# Patient Record
Sex: Female | Born: 1990 | Race: White | Hispanic: No | Marital: Married | Smoking: Never smoker
Health system: Southern US, Community
[De-identification: ages and names within clinical notes are randomized; demographics above are authoritative.]

## PROBLEM LIST (undated history)

## (undated) DIAGNOSIS — Z789 Other specified health status: Secondary | ICD-10-CM

## (undated) HISTORY — PX: HEMICOLECTOMY: SHX854

## (undated) HISTORY — DX: Other specified health status: Z78.9

## (undated) HISTORY — PX: WISDOM TOOTH EXTRACTION: SHX21

---

## 2014-12-11 LAB — HM PAP SMEAR

## 2017-05-01 DIAGNOSIS — M9904 Segmental and somatic dysfunction of sacral region: Secondary | ICD-10-CM | POA: Diagnosis not present

## 2017-05-01 DIAGNOSIS — M4608 Spinal enthesopathy, sacral and sacrococcygeal region: Secondary | ICD-10-CM | POA: Diagnosis not present

## 2017-05-01 DIAGNOSIS — M9903 Segmental and somatic dysfunction of lumbar region: Secondary | ICD-10-CM | POA: Diagnosis not present

## 2017-05-03 DIAGNOSIS — M4608 Spinal enthesopathy, sacral and sacrococcygeal region: Secondary | ICD-10-CM | POA: Diagnosis not present

## 2017-05-03 DIAGNOSIS — M9904 Segmental and somatic dysfunction of sacral region: Secondary | ICD-10-CM | POA: Diagnosis not present

## 2017-05-03 DIAGNOSIS — M9903 Segmental and somatic dysfunction of lumbar region: Secondary | ICD-10-CM | POA: Diagnosis not present

## 2017-05-14 DIAGNOSIS — M9904 Segmental and somatic dysfunction of sacral region: Secondary | ICD-10-CM | POA: Diagnosis not present

## 2017-05-14 DIAGNOSIS — M9903 Segmental and somatic dysfunction of lumbar region: Secondary | ICD-10-CM | POA: Diagnosis not present

## 2017-05-14 DIAGNOSIS — M4608 Spinal enthesopathy, sacral and sacrococcygeal region: Secondary | ICD-10-CM | POA: Diagnosis not present

## 2017-05-15 DIAGNOSIS — M4608 Spinal enthesopathy, sacral and sacrococcygeal region: Secondary | ICD-10-CM | POA: Diagnosis not present

## 2017-05-15 DIAGNOSIS — M9903 Segmental and somatic dysfunction of lumbar region: Secondary | ICD-10-CM | POA: Diagnosis not present

## 2017-05-15 DIAGNOSIS — M9904 Segmental and somatic dysfunction of sacral region: Secondary | ICD-10-CM | POA: Diagnosis not present

## 2017-05-17 DIAGNOSIS — M9904 Segmental and somatic dysfunction of sacral region: Secondary | ICD-10-CM | POA: Diagnosis not present

## 2017-05-17 DIAGNOSIS — M9903 Segmental and somatic dysfunction of lumbar region: Secondary | ICD-10-CM | POA: Diagnosis not present

## 2017-05-17 DIAGNOSIS — M4608 Spinal enthesopathy, sacral and sacrococcygeal region: Secondary | ICD-10-CM | POA: Diagnosis not present

## 2017-05-21 DIAGNOSIS — M9903 Segmental and somatic dysfunction of lumbar region: Secondary | ICD-10-CM | POA: Diagnosis not present

## 2017-05-21 DIAGNOSIS — M9904 Segmental and somatic dysfunction of sacral region: Secondary | ICD-10-CM | POA: Diagnosis not present

## 2017-05-21 DIAGNOSIS — M4608 Spinal enthesopathy, sacral and sacrococcygeal region: Secondary | ICD-10-CM | POA: Diagnosis not present

## 2017-05-22 DIAGNOSIS — M9903 Segmental and somatic dysfunction of lumbar region: Secondary | ICD-10-CM | POA: Diagnosis not present

## 2017-05-22 DIAGNOSIS — M4608 Spinal enthesopathy, sacral and sacrococcygeal region: Secondary | ICD-10-CM | POA: Diagnosis not present

## 2017-05-22 DIAGNOSIS — M9904 Segmental and somatic dysfunction of sacral region: Secondary | ICD-10-CM | POA: Diagnosis not present

## 2017-05-25 DIAGNOSIS — M9903 Segmental and somatic dysfunction of lumbar region: Secondary | ICD-10-CM | POA: Diagnosis not present

## 2017-05-25 DIAGNOSIS — M4608 Spinal enthesopathy, sacral and sacrococcygeal region: Secondary | ICD-10-CM | POA: Diagnosis not present

## 2017-05-25 DIAGNOSIS — M9904 Segmental and somatic dysfunction of sacral region: Secondary | ICD-10-CM | POA: Diagnosis not present

## 2017-05-29 DIAGNOSIS — M4608 Spinal enthesopathy, sacral and sacrococcygeal region: Secondary | ICD-10-CM | POA: Diagnosis not present

## 2017-05-29 DIAGNOSIS — M9903 Segmental and somatic dysfunction of lumbar region: Secondary | ICD-10-CM | POA: Diagnosis not present

## 2017-05-29 DIAGNOSIS — M9904 Segmental and somatic dysfunction of sacral region: Secondary | ICD-10-CM | POA: Diagnosis not present

## 2017-06-04 DIAGNOSIS — M9904 Segmental and somatic dysfunction of sacral region: Secondary | ICD-10-CM | POA: Diagnosis not present

## 2017-06-04 DIAGNOSIS — M9903 Segmental and somatic dysfunction of lumbar region: Secondary | ICD-10-CM | POA: Diagnosis not present

## 2017-06-04 DIAGNOSIS — M4608 Spinal enthesopathy, sacral and sacrococcygeal region: Secondary | ICD-10-CM | POA: Diagnosis not present

## 2017-06-12 ENCOUNTER — Encounter: Payer: Self-pay | Admitting: Family Medicine

## 2017-06-12 ENCOUNTER — Ambulatory Visit (INDEPENDENT_AMBULATORY_CARE_PROVIDER_SITE_OTHER): Payer: 59 | Admitting: Family Medicine

## 2017-06-12 VITALS — BP 110/80 | HR 75 | Temp 97.9°F | Resp 16 | Ht 63.0 in | Wt 148.0 lb

## 2017-06-12 DIAGNOSIS — M9904 Segmental and somatic dysfunction of sacral region: Secondary | ICD-10-CM | POA: Diagnosis not present

## 2017-06-12 DIAGNOSIS — M4608 Spinal enthesopathy, sacral and sacrococcygeal region: Secondary | ICD-10-CM | POA: Diagnosis not present

## 2017-06-12 DIAGNOSIS — Z0000 Encounter for general adult medical examination without abnormal findings: Secondary | ICD-10-CM

## 2017-06-12 DIAGNOSIS — M9903 Segmental and somatic dysfunction of lumbar region: Secondary | ICD-10-CM | POA: Diagnosis not present

## 2017-06-12 DIAGNOSIS — Z3041 Encounter for surveillance of contraceptive pills: Secondary | ICD-10-CM | POA: Diagnosis not present

## 2017-06-12 MED ORDER — JUNEL 1.5/30 1.5-30 MG-MCG PO TABS: ORAL_TABLET | ORAL | 3 refills | Status: DC

## 2017-06-12 NOTE — Patient Instructions (Signed)
Preventive Care 18-39 Years, Female Preventive care refers to lifestyle choices and visits with your health care provider that can promote health and wellness. What does preventive care include?  A yearly physical exam. This is also called an annual well check.  Dental exams once or twice a year.  Routine eye exams. Ask your health care provider how often you should have your eyes checked.  Personal lifestyle choices, including: ? Daily care of your teeth and gums. ? Regular physical activity. ? Eating a healthy diet. ? Avoiding tobacco and drug use. ? Limiting alcohol use. ? Practicing safe sex. ? Taking vitamin and mineral supplements as recommended by your health care provider. What happens during an annual well check? The services and screenings done by your health care provider during your annual well check will depend on your age, overall health, lifestyle risk factors, and family history of disease. Counseling Your health care provider may ask you questions about your:  Alcohol use.  Tobacco use.  Drug use.  Emotional well-being.  Home and relationship well-being.  Sexual activity.  Eating habits.  Work and work Statistician.  Method of birth control.  Menstrual cycle.  Pregnancy history.  Screening You may have the following tests or measurements:  Height, weight, and BMI.  Diabetes screening. This is done by checking your blood sugar (glucose) after you have not eaten for a while (fasting).  Blood pressure.  Lipid and cholesterol levels. These may be checked every 5 years starting at age 66.  Skin check.  Hepatitis C blood test.  Hepatitis B blood test.  Sexually transmitted disease (STD) testing.  BRCA-related cancer screening. This may be done if you have a family history of breast, ovarian, tubal, or peritoneal cancers.  Pelvic exam and Pap test. This may be done every 3 years starting at age 40. Starting at age 59, this may be done every 5  years if you have a Pap test in combination with an HPV test.  Discuss your test results, treatment options, and if necessary, the need for more tests with your health care provider. Vaccines Your health care provider may recommend certain vaccines, such as:  Influenza vaccine. This is recommended every year.  Tetanus, diphtheria, and acellular pertussis (Tdap, Td) vaccine. You may need a Td booster every 10 years.  Varicella vaccine. You may need this if you have not been vaccinated.  HPV vaccine. If you are 69 or younger, you may need three doses over 6 months.  Measles, mumps, and rubella (MMR) vaccine. You may need at least one dose of MMR. You may also need a second dose.  Pneumococcal 13-valent conjugate (PCV13) vaccine. You may need this if you have certain conditions and were not previously vaccinated.  Pneumococcal polysaccharide (PPSV23) vaccine. You may need one or two doses if you smoke cigarettes or if you have certain conditions.  Meningococcal vaccine. One dose is recommended if you are age 27-21 years and a first-year college student living in a residence hall, or if you have one of several medical conditions. You may also need additional booster doses.  Hepatitis A vaccine. You may need this if you have certain conditions or if you travel or work in places where you may be exposed to hepatitis A.  Hepatitis B vaccine. You may need this if you have certain conditions or if you travel or work in places where you may be exposed to hepatitis B.  Haemophilus influenzae type b (Hib) vaccine. You may need this if  you have certain risk factors.  Talk to your health care provider about which screenings and vaccines you need and how often you need them. This information is not intended to replace advice given to you by your health care provider. Make sure you discuss any questions you have with your health care provider. Document Released: 03/14/2001 Document Revised: 10/06/2015  Document Reviewed: 11/17/2014 Elsevier Interactive Patient Education  [employer].

## 2017-06-12 NOTE — Progress Notes (Signed)
Patient: Melinda Mcdowell, Female    DOB: July 06, 1990, 27 y.o.   MRN: [id number] Visit Date: 06/12/2017  Today's Provider: Lavon Paganini, MD   I, Martha Clan, CMA, am acting as scribe for Lavon Paganini, MD.  Chief Complaint  Patient presents with  . Establish Care   Subjective:    Melinda Mcdowell is a 27 y.o. female who presents today to establish care. She feels well. She reports exercising 6 days per week. She reports she is sleeping well.  Recently moved from [place] for husband's job.  Now working from home.  Thinks her immunization record would be at [place] - she will try to track this down Had pap smear <1 yr ago - Dr. Sydnee Levans in [place]  Previously having menorrhagia and dysmenorrhea. Now on continuous OCPs.   -----------------------------------------------------------------   Review of Systems  Constitutional: Negative.   HENT: Negative.   Eyes: Negative.   Respiratory: Negative.   Cardiovascular: Negative.   Gastrointestinal: Negative.   Endocrine: Negative.   Genitourinary: Negative.   Musculoskeletal: Negative.   Skin: Negative.   Allergic/Immunologic: Negative.   Neurological: Negative.   Hematological: Negative.   Psychiatric/Behavioral: Negative.     Social History      She  reports that she has never smoked. She has never used smokeless tobacco. She reports that she drinks about 1.8 oz of alcohol per week. She reports that she has current or past drug history.       Social History   Socioeconomic History  . Marital status: Married    Spouse name: Tomasita Crumble  . Number of children: 0  . Years of education: 16  . Highest education level: Bachelor's degree (e.g., BA, AB, BS)  Occupational History  . Occupation: Press photographer  Social Needs  . Financial resource strain: Not on file  . Food insecurity:    Worry: Not on file    Inability: Not on file  . Transportation needs:    Medical: Not on file   Non-medical: Not on file  Tobacco Use  . Smoking status: Never Smoker  . Smokeless tobacco: Never Used  Substance and Sexual Activity  . Alcohol use: Yes    Alcohol/week: 1.8 oz    Types: 3 Glasses of wine per week  . Drug use: Not Currently  . Sexual activity: Yes    Partners: Male    Birth control/protection: OCP  Lifestyle  . Physical activity:    Days per week: Not on file    Minutes per session: Not on file  . Stress: Not on file  Relationships  . Social connections:    Talks on phone: Not on file    Gets together: Not on file    Attends religious service: Not on file    Active member of club or organization: Not on file    Attends meetings of clubs or organizations: Not on file    Relationship status: Not on file  Other Topics Concern  . Not on file  Social History Narrative  . Not on file    History reviewed. No pertinent past medical history.   There are no active problems to display for this patient.   Past Surgical History:  Procedure Laterality Date  . WISDOM TOOTH EXTRACTION      Family History        Family Status  Relation Name Status  . Mother  Alive  . Father  Alive  . Brother  Alive  .  MGM  Alive  . Neg Hx  (Not Specified)        Her family history includes Asthma in her brother; Clotting disorder in her maternal grandmother; Diabetes in her maternal grandmother; Healthy in her mother; Hypertension in her father; Ovarian cysts in her mother; Stroke in her maternal grandmother. There is no history of Breast cancer, Colon cancer, Ovarian cancer, or Cervical cancer.      No Known Allergies   Current Outpatient Medications:  .  JUNEL 1.5/30 1.5-30 MG-MCG tablet, TAKE 1 TABLET BY MOUTH CONTINOUSLY. ACTIVE PILL USE, DO NOT TAKE PLACEBO PILLS., Disp: 4 Package, Rfl: 3   Patient Care Team: [name], MD as PCP - General (Family Medicine)      Objective:   Vitals: BP 110/80 (BP Location: Left Arm, Patient Position: Sitting, Cuff  Size: Normal)   Pulse 75   Temp 97.9 F (36.6 C) (Oral)   Resp 16   Ht 5\' 3"  (1.6 m)   Wt 148 lb (67.1 kg)   SpO2 99%   BMI 26.22 kg/m    Vitals:   06/12/17 0919  BP: 110/80  Pulse: 75  Resp: 16  Temp: 97.9 F (36.6 C)  TempSrc: Oral  SpO2: 99%  Weight: 148 lb (67.1 kg)  Height: 5\' 3"  (1.6 m)     Physical Exam  Constitutional: She is oriented to person, place, and time. She appears well-developed and well-nourished. No distress.  HENT:  Head: Normocephalic and atraumatic.  Right Ear: External ear normal.  Left Ear: External ear normal.  Nose: Nose normal.  Mouth/Throat: Oropharynx is clear and moist.  Eyes: Pupils are equal, round, and reactive to light. Conjunctivae and EOM are normal. No scleral icterus.  Neck: Neck supple. No thyromegaly present.  Cardiovascular: Normal rate, regular rhythm, normal heart sounds and intact distal pulses.  No murmur heard. Pulmonary/Chest: Effort normal and breath sounds normal. No respiratory distress. She has no wheezes. She has no rales.  Abdominal: Soft. Bowel sounds are normal. She exhibits no distension. There is no tenderness. There is no rebound and no guarding.  Musculoskeletal: She exhibits no edema or deformity.  Lymphadenopathy:    She has no cervical adenopathy.  Neurological: She is alert and oriented to person, place, and time.  Skin: Skin is warm and dry. Capillary refill takes less than 2 seconds. No rash noted.  Psychiatric: She has a normal mood and affect. Her behavior is normal.  Vitals reviewed.    Depression Screen PHQ 2/9 Scores 06/12/2017  PHQ - 2 Score 0      Assessment & Plan:     Routine Health Maintenance and Physical Exam  Exercise Activities and Dietary recommendations Goals    None       There is no immunization history on file for this patient.  Health Maintenance  Topic Date Due  . HIV Screening  01/20/2007  . TETANUS/TDAP  01/20/2011  . PAP SMEAR  01/19/2013  . INFLUENZA  VACCINE  08/30/2017     Discussed health benefits of physical activity, and encouraged her to engage in regular exercise appropriate for her age and condition.    --------------------------------------------------------------------  The entirety of the information documented in the History of Present Illness, Review of Systems and Physical Exam were personally obtained by me. Portions of this information were initially documented by Raquel Sarna Ratchford, CMA and reviewed by me for thoroughness and accuracy.   [name], MD, MPH [facility] 06/12/2017 9:49 AM

## 2017-06-19 DIAGNOSIS — M4608 Spinal enthesopathy, sacral and sacrococcygeal region: Secondary | ICD-10-CM | POA: Diagnosis not present

## 2017-06-19 DIAGNOSIS — M9903 Segmental and somatic dysfunction of lumbar region: Secondary | ICD-10-CM | POA: Diagnosis not present

## 2017-06-19 DIAGNOSIS — M9904 Segmental and somatic dysfunction of sacral region: Secondary | ICD-10-CM | POA: Diagnosis not present

## 2017-06-28 DIAGNOSIS — M9903 Segmental and somatic dysfunction of lumbar region: Secondary | ICD-10-CM | POA: Diagnosis not present

## 2017-06-28 DIAGNOSIS — M9904 Segmental and somatic dysfunction of sacral region: Secondary | ICD-10-CM | POA: Diagnosis not present

## 2017-06-28 DIAGNOSIS — M4608 Spinal enthesopathy, sacral and sacrococcygeal region: Secondary | ICD-10-CM | POA: Diagnosis not present

## 2017-08-27 DIAGNOSIS — M9901 Segmental and somatic dysfunction of cervical region: Secondary | ICD-10-CM | POA: Diagnosis not present

## 2017-08-27 DIAGNOSIS — M62830 Muscle spasm of back: Secondary | ICD-10-CM | POA: Diagnosis not present

## 2017-08-27 DIAGNOSIS — M50222 Other cervical disc displacement at C5-C6 level: Secondary | ICD-10-CM | POA: Diagnosis not present

## 2017-08-30 DIAGNOSIS — M62830 Muscle spasm of back: Secondary | ICD-10-CM | POA: Diagnosis not present

## 2017-08-30 DIAGNOSIS — M9901 Segmental and somatic dysfunction of cervical region: Secondary | ICD-10-CM | POA: Diagnosis not present

## 2017-08-30 DIAGNOSIS — M50222 Other cervical disc displacement at C5-C6 level: Secondary | ICD-10-CM | POA: Diagnosis not present

## 2017-09-10 ENCOUNTER — Ambulatory Visit: Payer: 59 | Admitting: Family Medicine

## 2017-10-09 ENCOUNTER — Encounter: Payer: Self-pay | Admitting: Family Medicine

## 2018-02-04 ENCOUNTER — Encounter: Payer: Self-pay | Admitting: Obstetrics and Gynecology

## 2018-02-04 ENCOUNTER — Other Ambulatory Visit (HOSPITAL_COMMUNITY)
Admission: RE | Admit: 2018-02-04 | Discharge: 2018-02-04 | Disposition: A | Discharge status: Home / Self Care | Payer: 59 | Source: Ambulatory Visit | Attending: Obstetrics and Gynecology | Admitting: Obstetrics and Gynecology

## 2018-02-04 ENCOUNTER — Ambulatory Visit (INDEPENDENT_AMBULATORY_CARE_PROVIDER_SITE_OTHER): Payer: 59 | Admitting: Obstetrics and Gynecology

## 2018-02-04 ENCOUNTER — Telehealth: Payer: Self-pay

## 2018-02-04 VITALS — BP 118/80 | HR 74 | Ht 63.0 in | Wt 149.0 lb

## 2018-02-04 DIAGNOSIS — Z124 Encounter for screening for malignant neoplasm of cervix: Secondary | ICD-10-CM

## 2018-02-04 DIAGNOSIS — N760 Acute vaginitis: Secondary | ICD-10-CM

## 2018-02-04 DIAGNOSIS — B9689 Other specified bacterial agents as the cause of diseases classified elsewhere: Secondary | ICD-10-CM

## 2018-02-04 LAB — POCT WET PREP WITH KOH
KOH PREP POC: NEGATIVE
Trichomonas, UA: NEGATIVE
Yeast Wet Prep HPF POC: NEGATIVE

## 2018-02-04 MED ORDER — SECNIDAZOLE 2 G PO PACK: 2.0000 g | PACK | Freq: Once | ORAL | 0 refills | Status: DC

## 2018-02-04 NOTE — Telephone Encounter (Signed)
Pt has never been seen by [place]; has question; appt or something?  [phone]  Adv pt I couldn't adv her b/c we have never seen her.  Pt states it is a GYN problem.  Adv would need to sched appt.  Tx'd to SP to schedule.

## 2018-02-04 NOTE — Patient Instructions (Signed)
I value your feedback and entrusting us with your care. If you get a [facility] patient survey, I would appreciate you taking the time to let us know about your experience today. Thank you! 

## 2018-02-04 NOTE — Progress Notes (Signed)
[name], MD   Chief Complaint  Patient presents with  . Bacterial Vaginosis    fishy odor, little discharge, no itchiness or irritation x 1 month    HPI:      [name] is a 28 y.o. G0P0000 who LMP was No LMP recorded. (Menstrual status: Oral contraceptives)., presents today for NP eval of  increased d/c with fishy odor, no irritation, for 1 1/2 months. No LBP, belly pain, fevers. She uses summers eve soap and dryer sheets. No recent abx use, no meds to treat. She is sex active with husband, no new partners. Uses OCPs.  No recent pap. Has annuals with PCP, next one 5/20.   History reviewed. No pertinent past medical history.  Past Surgical History:  Procedure Laterality Date  . WISDOM TOOTH EXTRACTION      Family History  Problem Relation Age of Onset  . Healthy Mother   . Ovarian cysts Mother   . Hypertension Father   . Asthma Brother   . Clotting disorder Maternal Grandmother   . Stroke Maternal Grandmother   . Diabetes Maternal Grandmother   . Breast cancer Neg Hx   . Colon cancer Neg Hx   . Ovarian cancer Neg Hx   . Cervical cancer Neg Hx     Social History   Socioeconomic History  . Marital status: Married    Spouse name: Tomasita Crumble  . Number of children: 0  . Years of education: 16  . Highest education level: Bachelor's degree (e.g., BA, AB, BS)  Occupational History  . Occupation: Press photographer  Social Needs  . Financial resource strain: Not on file  . Food insecurity:    Worry: Not on file    Inability: Not on file  . Transportation needs:    Medical: Not on file    Non-medical: Not on file  Tobacco Use  . Smoking status: Never Smoker  . Smokeless tobacco: Never Used  Substance and Sexual Activity  . Alcohol use: Yes    Alcohol/week: 3.0 standard drinks    Types: 3 Glasses of wine per week  . Drug use: Not Currently  . Sexual activity: Yes    Partners: Male    Birth control/protection: OCP  Lifestyle  .  Physical activity:    Days per week: Not on file    Minutes per session: Not on file  . Stress: Not on file  Relationships  . Social connections:    Talks on phone: Not on file    Gets together: Not on file    Attends religious service: Not on file    Active member of club or organization: Not on file    Attends meetings of clubs or organizations: Not on file    Relationship status: Not on file  . Intimate partner violence:    Fear of current or ex partner: Not on file    Emotionally abused: Not on file    Physically abused: Not on file    Forced sexual activity: Not on file  Other Topics Concern  . Not on file  Social History Narrative  . Not on file    Outpatient Medications Prior to Visit  Medication Sig Dispense Refill  . JUNEL 1.5/30 1.5-30 MG-MCG tablet TAKE 1 TABLET BY MOUTH CONTINOUSLY. ACTIVE PILL USE, DO NOT TAKE PLACEBO PILLS. 4 Package 3   No facility-administered medications prior to visit.       ROS:  Review of Systems  Constitutional: Negative  for fatigue, fever and unexpected weight change.  Respiratory: Negative for cough, shortness of breath and wheezing.   Cardiovascular: Negative for chest pain, palpitations and leg swelling.  Gastrointestinal: Negative for blood in stool, constipation, diarrhea, nausea and vomiting.  Endocrine: Negative for cold intolerance, heat intolerance and polyuria.  Genitourinary: Positive for vaginal discharge. Negative for dyspareunia, dysuria, flank pain, frequency, genital sores, hematuria, menstrual problem, pelvic pain, urgency, vaginal bleeding and vaginal pain.  Musculoskeletal: Negative for back pain, joint swelling and myalgias.  Skin: Negative for rash.  Neurological: Negative for dizziness, syncope, light-headedness, numbness and headaches.  Hematological: Negative for adenopathy.  Psychiatric/Behavioral: Negative for agitation, confusion, sleep disturbance and suicidal ideas. The patient is not nervous/anxious.     BREAST: No symptoms   OBJECTIVE:   Vitals:  BP 118/80   Pulse 74   Ht 5\' 3"  (1.6 m)   Wt 149 lb (67.6 kg)   BMI 26.39 kg/m   Physical Exam Vitals signs reviewed.  Constitutional:      Appearance: She is well-developed.  Pulmonary:     Effort: Pulmonary effort is normal.  Genitourinary:    General: Normal vulva.     Pubic Area: No rash.      Labia:        Right: No rash, tenderness or lesion.        Left: No rash, tenderness or lesion.      Vagina: Normal. No vaginal discharge, erythema or tenderness.     Cervix: Normal.     Uterus: Normal. Not enlarged and not tender.      Adnexa: Right adnexa normal and left adnexa normal.       Right: No mass or tenderness.         Left: No mass or tenderness.    Musculoskeletal: Normal range of motion.  Neurological:     Mental Status: She is alert and oriented to person, place, and time.  Psychiatric:        Behavior: Behavior normal.        Thought Content: Thought content normal.     Results: Results for orders placed or performed in visit on 02/04/18 (from the past 24 hour(s))  POCT Wet Prep with KOH     Status: Abnormal   Collection Time: 02/04/18  4:11 PM  Result Value Ref Range   Trichomonas, UA Negative    Clue Cells Wet Prep HPF POC few    Epithelial Wet Prep HPF POC     Yeast Wet Prep HPF POC neg    Bacteria Wet Prep HPF POC     RBC Wet Prep HPF POC     WBC Wet Prep HPF POC     KOH Prep POC Negative Negative     Assessment/Plan: Bacterial vaginosis - Wet prep inconclusive. Treat empirically given sx. Rx solosec/coupon card. F/u prn. Will re-eval further if sx persist. - Plan: Secnidazole (SOLOSEC) 2 g PACK, POCT Wet Prep with KOH  Cervical cancer screening - Plan: Cytology - PAP    Meds ordered this encounter  Medications  . Secnidazole (SOLOSEC) 2 g PACK    Sig: Take 2 g by mouth once for 1 dose. Mix 2 g granules with yogurt or pudding for 1 dose    Dispense:  1 each    Refill:  0    Order  Specific Question:   Supervising Provider    Answer:   Gae Dry [[id number]]      Return if symptoms worsen or fail  to improve.  Kairav Russomanno B. Cadence Haslam, PA-C 02/04/2018 4:12 PM

## 2018-02-05 LAB — CYTOLOGY - PAP: Diagnosis: NEGATIVE

## 2018-03-29 ENCOUNTER — Telehealth: Payer: Self-pay

## 2018-03-29 NOTE — Telephone Encounter (Signed)
Pt calling for refill of the granuals that you put in yogurt or applesauce.  She is having the same problem and was told if sxs came back to call and refill would be sent in.  [phone]

## 2018-03-31 ENCOUNTER — Other Ambulatory Visit: Payer: Self-pay | Admitting: Obstetrics and Gynecology

## 2018-03-31 DIAGNOSIS — N760 Acute vaginitis: Principal | ICD-10-CM

## 2018-03-31 DIAGNOSIS — B9689 Other specified bacterial agents as the cause of diseases classified elsewhere: Secondary | ICD-10-CM

## 2018-03-31 MED ORDER — SECNIDAZOLE 2 G PO PACK: 2.0000 g | PACK | Freq: Once | ORAL | 0 refills | Status: AC

## 2018-03-31 NOTE — Progress Notes (Signed)
Rx RF solosec for recurrent BV sx. RTO if sx persist.

## 2018-03-31 NOTE — Telephone Encounter (Signed)
Rx RF solosec eRxd. Should be able to use same coupon card. If not, she can come by office to get one. RTO if sx persist

## 2018-04-01 NOTE — Telephone Encounter (Signed)
Pt aware. Says she is out of town and if she can get Rx resent to another pharmacy near her. She will check with pharmacy and call us back.

## 2018-06-14 ENCOUNTER — Ambulatory Visit (INDEPENDENT_AMBULATORY_CARE_PROVIDER_SITE_OTHER): Payer: 59 | Admitting: Physician Assistant

## 2018-06-14 ENCOUNTER — Encounter: Payer: Self-pay | Admitting: Family Medicine

## 2018-06-14 ENCOUNTER — Encounter: Payer: Self-pay | Admitting: Physician Assistant

## 2018-06-14 ENCOUNTER — Other Ambulatory Visit: Payer: Self-pay

## 2018-06-14 VITALS — BP 126/88 | HR 70 | Temp 98.4°F | Resp 16 | Wt 144.2 lb

## 2018-06-14 DIAGNOSIS — K219 Gastro-esophageal reflux disease without esophagitis: Secondary | ICD-10-CM | POA: Diagnosis not present

## 2018-06-14 MED ORDER — OMEPRAZOLE 40 MG PO CPDR: 40.0000 mg | DELAYED_RELEASE_CAPSULE | Freq: Every day | ORAL | 0 refills | Status: DC

## 2018-06-14 NOTE — Progress Notes (Signed)
Patient: Melinda Mcdowell Female    DOB: 10-10-1990   28 y.o.   MRN: [id number] Visit Date: 06/14/2018  Today's Provider: Mar Daring, PA-C   Chief Complaint  Patient presents with  . Abdominal Pain   Subjective:     Abdominal Pain  This is a new problem. The current episode started in the past 7 days (started on Sunday). The onset quality is gradual. The problem occurs constantly. The problem has been gradually worsening. The pain is located in the epigastric region. The pain is at a severity of 5/10 (when she is eating 5/10). The pain is moderate. The quality of the pain is burning and sharp. The abdominal pain radiates to the back (mid section). Associated symptoms include nausea. Pertinent negatives include no constipation, diarrhea or vomiting. Associated symptoms comments: In the last 24 hours she was feeling nausea with the pain.. The pain is aggravated by eating (Drinking). She has tried antacids for the symptoms. The treatment provided no relief.   She reports she took aleve on Sunday for some arthralgias and had immediate epigastric pain following taking the medication. The pain radiates through to her mid back. Pain is sharp, burning  and stabbing. She reports she has had some mild stomach pains for about 3 months but kept putting off and would write it off as another cause and it always would subside. Since Sunday pain has been constant. She reports trying zantac last night without relief. Denies vomiting, hematochezia or melena.   No Known Allergies   Current Outpatient Medications:  .  JUNEL 1.5/30 1.5-30 MG-MCG tablet, TAKE 1 TABLET BY MOUTH CONTINOUSLY. ACTIVE PILL USE, DO NOT TAKE PLACEBO PILLS., Disp: 4 Package, Rfl: 3  Review of Systems  Constitutional: Negative.   Respiratory: Negative.   Cardiovascular: Negative.   Gastrointestinal: Positive for abdominal pain and nausea. Negative for abdominal distention, anal bleeding, blood in stool, constipation,  diarrhea, rectal pain and vomiting.  Genitourinary: Negative.   Musculoskeletal: Negative.   Neurological: Negative.     Social History   Tobacco Use  . Smoking status: Never Smoker  . Smokeless tobacco: Never Used  Substance Use Topics  . Alcohol use: Yes    Alcohol/week: 3.0 standard drinks    Types: 3 Glasses of wine per week      Objective:   Wt 144 lb 3.2 oz (65.4 kg)   BMI 25.54 kg/m  Vitals:   06/14/18 1003  Weight: 144 lb 3.2 oz (65.4 kg)     Physical Exam Vitals signs reviewed.  Constitutional:      General: She is not in acute distress.    Appearance: Normal appearance. She is well-developed and normal weight. She is not ill-appearing or diaphoretic.  HENT:     Head: Normocephalic and atraumatic.  Cardiovascular:     Rate and Rhythm: Normal rate and regular rhythm.     Heart sounds: Normal heart sounds. No murmur. No friction rub. No gallop.   Pulmonary:     Effort: Pulmonary effort is normal. No respiratory distress.     Breath sounds: Normal breath sounds. No wheezing or rales.  Abdominal:     General: Bowel sounds are normal. There is no distension.     Palpations: Abdomen is soft. There is no mass.     Tenderness: There is abdominal tenderness in the epigastric area. There is no guarding or rebound.  Skin:    General: Skin is warm and dry.  Neurological:  Mental Status: She is alert and oriented to person, place, and time.         Assessment & Plan    1. Gastroesophageal reflux disease without esophagitis Will check H. Pylori breath test as below since patient has been having abdominal pain x 2-3 months off and on with increased fatigue and patient has not been on a PPI. I will f/u pending results. I will also start Omeprazole as below. Advised patient to try for 14 days. If no improvement or worsening symptoms she is to call and we will refer to GI for EGD consideration. She is in agreement. I will see her back in 4 weeks to make sure  symptoms are improving.  - H. pylori breath test - omeprazole (PRILOSEC) 40 MG capsule; Take 1 capsule (40 mg total) by mouth daily.  Dispense: 90 capsule; Refill: 0     Mar Daring, PA-C  [facility]

## 2018-06-14 NOTE — Patient Instructions (Signed)
Tumeric 500-1000mg  for inflammation   Gastroesophageal Reflux Disease, Adult Gastroesophageal reflux (GER) happens when acid from the stomach flows up into the tube that connects the mouth and the stomach (esophagus). Normally, food travels down the esophagus and stays in the stomach to be digested. With GER, food and stomach acid sometimes move back up into the esophagus. You may have a disease called gastroesophageal reflux disease (GERD) if the reflux:  Happens often.  Causes frequent or very bad symptoms.  Causes problems such as damage to the esophagus. When this happens, the esophagus becomes sore and swollen (inflamed). Over time, GERD can make small holes (ulcers) in the lining of the esophagus. What are the causes? This condition is caused by a problem with the muscle between the esophagus and the stomach. When this muscle is weak or not normal, it does not close properly to keep food and acid from coming back up from the stomach. The muscle can be weak because of:  Tobacco use.  Pregnancy.  Having a certain type of hernia (hiatal hernia).  Alcohol use.  Certain foods and drinks, such as coffee, chocolate, onions, and peppermint. What increases the risk? You are more likely to develop this condition if you:  Are overweight.  Have a disease that affects your connective tissue.  Use NSAID medicines. What are the signs or symptoms? Symptoms of this condition include:  Heartburn.  Difficult or painful swallowing.  The feeling of having a lump in the throat.  A bitter taste in the mouth.  Bad breath.  Having a lot of saliva.  Having an upset or bloated stomach.  Belching.  Chest pain. Different conditions can cause chest pain. Make sure you see your doctor if you have chest pain.  Shortness of breath or noisy breathing (wheezing).  Ongoing (chronic) cough or a cough at night.  Wearing away of the surface of teeth (tooth enamel).  Weight loss. How is  this treated? Treatment will depend on how bad your symptoms are. Your doctor may suggest:  Changes to your diet.  Medicine.  Surgery. Follow these instructions at home: Eating and drinking   Follow a diet as told by your doctor. You may need to avoid foods and drinks such as: ? Coffee and tea (with or without caffeine). ? Drinks that contain alcohol. ? Energy drinks and sports drinks. ? Bubbly (carbonated) drinks or sodas. ? Chocolate and cocoa. ? Peppermint and mint flavorings. ? Garlic and onions. ? Horseradish. ? Spicy and acidic foods. These include peppers, chili powder, curry powder, vinegar, hot sauces, and BBQ sauce. ? Citrus fruit juices and citrus fruits, such as oranges, lemons, and limes. ? Tomato-based foods. These include red sauce, chili, salsa, and pizza with red sauce. ? Fried and fatty foods. These include donuts, french fries, potato chips, and high-fat dressings. ? High-fat meats. These include hot dogs, rib eye steak, sausage, ham, and bacon. ? High-fat dairy items, such as whole milk, butter, and cream cheese.  Eat small meals often. Avoid eating large meals.  Avoid drinking large amounts of liquid with your meals.  Avoid eating meals during the 2-3 hours before bedtime.  Avoid lying down right after you eat.  Do not exercise right after you eat. Lifestyle   Do not use any products that contain nicotine or tobacco. These include cigarettes, e-cigarettes, and chewing tobacco. If you need help quitting, ask your doctor.  Try to lower your stress. If you need help doing this, ask your doctor.  If  you are overweight, lose an amount of weight that is healthy for you. Ask your doctor about a safe weight loss goal. General instructions  Pay attention to any changes in your symptoms.  Take over-the-counter and prescription medicines only as told by your doctor. Do not take aspirin, ibuprofen, or other NSAIDs unless your doctor says it is okay.  Wear  loose clothes. Do not wear anything tight around your waist.  Raise (elevate) the head of your bed about 6 inches (15 cm).  Avoid bending over if this makes your symptoms worse.  Keep all follow-up visits as told by your doctor. This is important. Contact a doctor if:  You have new symptoms.  You lose weight and you do not know why.  You have trouble swallowing or it hurts to swallow.  You have wheezing or a cough that keeps happening.  Your symptoms do not get better with treatment.  You have a hoarse voice. Get help right away if:  You have pain in your arms, neck, jaw, teeth, or back.  You feel sweaty, dizzy, or light-headed.  You have chest pain or shortness of breath.  You throw up (vomit) and your throw-up looks like blood or coffee grounds.  You pass out (faint).  Your poop (stool) is bloody or black.  You cannot swallow, drink, or eat. Summary  If a person has gastroesophageal reflux disease (GERD), food and stomach acid move back up into the esophagus and cause symptoms or problems such as damage to the esophagus.  Treatment will depend on how bad your symptoms are.  Follow a diet as told by your doctor.  Take all medicines only as told by your doctor. This information is not intended to replace advice given to you by your health care provider. Make sure you discuss any questions you have with your health care provider. Document Released: 07/05/2007 Document Revised: 07/25/2017 Document Reviewed: 07/25/2017 Elsevier Interactive Patient Education  2019 [place] for Gastroesophageal Reflux Disease, Adult When you have gastroesophageal reflux disease (GERD), the foods you eat and your eating habits are very important. Choosing the right foods can help ease your discomfort. Think about working with a nutrition specialist (dietitian) to help you make good choices. What are tips for following this plan?  Meals  Choose healthy foods that are  low in fat, such as fruits, vegetables, whole grains, low-fat dairy products, and lean meat, fish, and poultry.  Eat small meals often instead of 3 large meals a day. Eat your meals slowly, and in a place where you are relaxed. Avoid bending over or lying down until 2-3 hours after eating.  Avoid eating meals 2-3 hours before bed.  Avoid drinking a lot of liquid with meals.  Cook foods using methods other than frying. Bake, grill, or broil food instead.  Avoid or limit: ? Chocolate. ? Peppermint or spearmint. ? Alcohol. ? Pepper. ? Black and decaffeinated coffee. ? Black and decaffeinated tea. ? Bubbly (carbonated) soft drinks. ? Caffeinated energy drinks and soft drinks.  Limit high-fat foods such as: ? Fatty meat or fried foods. ? Whole milk, cream, butter, or ice cream. ? Nuts and nut butters. ? Pastries, donuts, and sweets made with butter or shortening.  Avoid foods that cause symptoms. These foods may be different for everyone. Common foods that cause symptoms include: ? Tomatoes. ? Oranges, lemons, and limes. ? Peppers. ? Spicy food. ? Onions and garlic. ? Vinegar. Lifestyle  Maintain a healthy weight. Ask  your doctor what weight is healthy for you. If you need to lose weight, work with your doctor to do so safely.  Exercise for at least 30 minutes for 5 or more days each week, or as told by your doctor.  Wear loose-fitting clothes.  Do not smoke. If you need help quitting, ask your doctor.  Sleep with the head of your bed higher than your feet. Use a wedge under the mattress or blocks under the bed frame to raise the head of the bed. Summary  When you have gastroesophageal reflux disease (GERD), food and lifestyle choices are very important in easing your symptoms.  Eat small meals often instead of 3 large meals a day. Eat your meals slowly, and in a place where you are relaxed.  Limit high-fat foods such as fatty meat or fried foods.  Avoid bending over or  lying down until 2-3 hours after eating.  Avoid peppermint and spearmint, caffeine, alcohol, and chocolate. This information is not intended to replace advice given to you by your health care provider. Make sure you discuss any questions you have with your health care provider. Document Released: 07/18/2011 Document Revised: 02/22/2016 Document Reviewed: 02/22/2016 Elsevier Interactive Patient Education  2019 [employer].

## 2018-06-15 LAB — H. PYLORI BREATH TEST: H pylori Breath Test: NEGATIVE

## 2018-06-26 ENCOUNTER — Telehealth: Payer: Self-pay

## 2018-06-26 NOTE — Telephone Encounter (Signed)
Patient advised as directed below. 

## 2018-06-26 NOTE — Telephone Encounter (Signed)
-----   Message from Mar Daring, PA-C sent at 06/25/2018  1:16 PM EDT ----- H. Pylori breath test was negative,

## 2018-07-11 ENCOUNTER — Other Ambulatory Visit: Payer: Self-pay | Admitting: Family Medicine

## 2018-09-05 ENCOUNTER — Other Ambulatory Visit: Payer: Self-pay | Admitting: Physician Assistant

## 2018-09-05 DIAGNOSIS — K219 Gastro-esophageal reflux disease without esophagitis: Secondary | ICD-10-CM

## 2018-12-30 ENCOUNTER — Other Ambulatory Visit: Payer: Self-pay | Admitting: Family Medicine

## 2019-01-09 ENCOUNTER — Ambulatory Visit: Payer: Self-pay | Admitting: *Deleted

## 2019-01-09 NOTE — Telephone Encounter (Signed)
From [facility] 

## 2019-01-09 NOTE — Telephone Encounter (Signed)
Pt. Wants to know what is safe to take for chills and cough. Instructed she can take Tylenol and Mucinex, Robitussin or Delsym for cough. Recommend hot tea with lemon and honey. Verbalizes understanding. Instructed to rest and stay hydrated.

## 2019-01-09 NOTE — Telephone Encounter (Signed)
FYI

## 2019-06-12 ENCOUNTER — Other Ambulatory Visit: Payer: Self-pay | Admitting: Family Medicine

## 2019-06-12 NOTE — Telephone Encounter (Signed)
Requested medication (s) are due for refill today: Yes  Requested medication (s) are on the active medication list: Yes  Last refill:  12/30/18  Future visit scheduled: No  Notes to clinic:  Unable to leave message to make an appointment - mailbox is full.    Requested Prescriptions  Pending Prescriptions Disp Refills   JUNEL 1.5/30 1.5-30 MG-MCG tablet [Pharmacy Med Name: JUNEL 1.5 MG-30 MCG TABLET] 84 tablet 1    Sig: TAKE 1 TABLET BY MOUTH CONTINOUSLY. ACTIVE PILL USE, DO NOT TAKE PLACEBO PILLS.      OB/GYN:  Contraceptives Failed - 06/12/2019  7:44 AM      Failed - Valid encounter within last 12 months    Recent Outpatient Visits           12 months ago Gastroesophageal reflux disease without esophagitis   [facility] [place], Clearnce Sorrel, [place]   2 years ago Encounter for annual physical exam   [facility] [place], Dionne Bucy, [place]              Passed - Last BP in normal range    BP Readings from Last 1 Encounters:  06/14/18 126/88

## 2019-08-28 ENCOUNTER — Telehealth: Payer: Self-pay

## 2019-08-28 ENCOUNTER — Other Ambulatory Visit: Payer: Self-pay | Admitting: Family Medicine

## 2019-08-28 NOTE — Telephone Encounter (Signed)
Pt has annual scheduled with [facility] on 8/16 and needs BC RF to get to her annual. Prescribed by another clinic, can we refill?

## 2019-08-28 NOTE — Telephone Encounter (Signed)
Requested  medications are  due for refill today yes  Requested medications are on the active medication list yes  Last refill 5/13  Last visit 05/2017  Future visit scheduled No  Notes to clinic Failed protocol due to no visit within 12 months and no upcoming visit scheduled.

## 2019-08-29 ENCOUNTER — Other Ambulatory Visit: Payer: Self-pay | Admitting: Obstetrics and Gynecology

## 2019-08-29 MED ORDER — NORETHINDRONE ACET-ETHINYL EST 1.5-30 MG-MCG PO TABS: ORAL_TABLET | ORAL | 0 refills | Status: DC

## 2019-08-29 NOTE — Telephone Encounter (Signed)
I sent in RF. Pls notify pt

## 2019-08-29 NOTE — Telephone Encounter (Signed)
Called pt, no answer, LVMTRC. 

## 2019-08-29 NOTE — Telephone Encounter (Signed)
Tried again, no answer, did not leave msg this time.

## 2019-08-29 NOTE — Progress Notes (Signed)
Rx RF OCP till annual.

## 2019-09-12 NOTE — Progress Notes (Deleted)
PCP:  [name], MD   No chief complaint on file.    HPI:      [name] is a 29 y.o. G0P0000 whose LMP was No LMP recorded. (Menstrual status: Oral contraceptives)., presents today for her annual examination.  Her menses are {norm/abn:715}, lasting {number:22536} days.  Dysmenorrhea {dysmen:716}. She {does:18564} have intermenstrual bleeding.  Sex activity: {sex active:315163}.  Last Pap: 02/04/18  Results were: no abnormalities  Hx of STDs: {STD hx:14358}  There is no FH of breast cancer. There is no FH of ovarian cancer. The patient {does:18564} do self-breast exams.  Tobacco use: {tob:20664} Alcohol use: {Alcohol:11675} No drug use.  Exercise: {exercise:31265}  She {does:18564} get adequate calcium and Vitamin D in her diet.    The pregnancy intention screening data noted above was reviewed. Potential methods of contraception were discussed. The patient elected to proceed with {Upstream End Methods:24109}.    No past medical history on file.  Past Surgical History:  Procedure Laterality Date  . WISDOM TOOTH EXTRACTION      Family History  Problem Relation Age of Onset  . Healthy Mother   . Ovarian cysts Mother   . Hypertension Father   . Asthma Brother   . Clotting disorder Maternal Grandmother   . Stroke Maternal Grandmother   . Diabetes Maternal Grandmother   . Breast cancer Neg Hx   . Colon cancer Neg Hx   . Ovarian cancer Neg Hx   . Cervical cancer Neg Hx     Social History   Socioeconomic History  . Marital status: Married    Spouse name: Tomasita Crumble  . Number of children: 0  . Years of education: 16  . Highest education level: Bachelor's degree (e.g., BA, AB, BS)  Occupational History  . Occupation: Press photographer  Tobacco Use  . Smoking status: Never Smoker  . Smokeless tobacco: Never Used  Vaping Use  . Vaping Use: Never used  Substance and Sexual Activity  . Alcohol use: Yes    Alcohol/week: 3.0 standard drinks      Types: 3 Glasses of wine per week  . Drug use: Not Currently  . Sexual activity: Yes    Partners: Male    Birth control/protection: OCP  Other Topics Concern  . Not on file  Social History Narrative  . Not on file   Social Determinants of Health   Financial Resource Strain:   . Difficulty of Paying Living Expenses:   Food Insecurity:   . Worried About Charity fundraiser in the Last Year:   . Arboriculturist in the Last Year:   Transportation Needs:   . Film/video editor (Medical):   Marland Kitchen Lack of Transportation (Non-Medical):   Physical Activity:   . Days of Exercise per Week:   . Minutes of Exercise per Session:   Stress:   . Feeling of Stress :   Social Connections:   . Frequency of Communication with Friends and Family:   . Frequency of Social Gatherings with Friends and Family:   . Attends Religious Services:   . Active Member of Clubs or Organizations:   . Attends Archivist Meetings:   Marland Kitchen Marital Status:   Intimate Partner Violence:   . Fear of Current or Ex-Partner:   . Emotionally Abused:   Marland Kitchen Physically Abused:   . Sexually Abused:      Current Outpatient Medications:  .  Norethindrone Acetate-Ethinyl Estradiol (JUNEL 1.5/30) 1.5-30 MG-MCG tablet, TAKE  1 TABLET BY MOUTH CONTINOUSLY. ACTIVE PILL USE, DO NOT TAKE PLACEBO PILLS.  Pt needs office visit before anymore refills., Disp: 28 tablet, Rfl: 0 .  omeprazole (PRILOSEC) 40 MG capsule, TAKE 1 CAPSULE BY MOUTH EVERY DAY, Disp: 90 capsule, Rfl: 0     ROS:  Review of Systems BREAST: No symptoms   Objective: There were no vitals taken for this visit.   OBGyn Exam  Results: No results found for this or any previous visit (from the past 24 hour(s)).  Assessment/Plan: No diagnosis found.  No orders of the defined types were placed in this encounter.            GYN counsel {counseling:16159}     F/U  No follow-ups on file.  Chanika Byland B. Lopaka Karge, PA-C 09/12/2019 11:29 AM

## 2019-09-15 ENCOUNTER — Ambulatory Visit: Payer: 59 | Admitting: Obstetrics and Gynecology

## 2019-09-16 NOTE — Patient Instructions (Signed)
I value your feedback and entrusting us with your care. If you get a [facility] patient survey, I would appreciate you taking the time to let us know about your experience today. Thank you!  As of January 09, 2019, your lab results will be released to your MyChart immediately, before I even have a chance to see them. Please give me time to review them and contact you if there are any abnormalities. Thank you for your patience.  

## 2019-09-16 NOTE — Progress Notes (Signed)
PCP:  [name], MD   Chief Complaint  Patient presents with  . Gynecologic Exam     HPI:      [name] is a 29 y.o. G0P0000 whose LMP was No LMP recorded. (Menstrual status: Oral contraceptives)., presents today for her annual examination.  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  Dysmenorrhea none. She does not have intermenstrual bleeding unless she misses a pill.  Sex activity: single partner, contraception - OCP (estrogen/progesterone). May want to conceive in yr or so. Taking MVI. Last Pap: 02/04/18  Results were: no abnormalities  Hx of STDs: none Hx of BV in past, no sx recently.  There is no FH of breast cancer. There is no FH of ovarian cancer. The patient does do self-breast exams.  Tobacco use: The patient denies current or previous tobacco use. Alcohol use: social drinker No drug use.  Exercise: very active  She does get adequate calcium but not Vitamin D in her diet.  Has had lethargy/fatigue past 6 months and notices hair falling out/thinning. No recent labs. FH thyroid disorders in mom and mat aunt.      Upstream - 09/17/19 1022      Pregnancy Intention Screening   Does the patient want to become pregnant in the next year? Yes    Does the patient's partner want to become pregnant in the next year? Yes    Would the patient like to discuss contraceptive options today? No      Contraception Wrap Up   Current Method Oral Contraceptive          The pregnancy intention screening data noted above was reviewed. Potential methods of contraception were discussed. The patient elected to proceed with Oral Contraceptive.    History reviewed. No pertinent past medical history.  Past Surgical History:  Procedure Laterality Date  . WISDOM TOOTH EXTRACTION      Family History  Problem Relation Age of Onset  . Healthy Mother   . Ovarian cysts Mother   . Hypertension Father    . Asthma Brother   . Clotting disorder Maternal Grandmother   . Stroke Maternal Grandmother   . Diabetes Maternal Grandmother   . Breast cancer Neg Hx   . Colon cancer Neg Hx   . Ovarian cancer Neg Hx   . Cervical cancer Neg Hx     Social History   Socioeconomic History  . Marital status: Married    Spouse name: Tomasita Crumble  . Number of children: 0  . Years of education: 16  . Highest education level: Bachelor's degree (e.g., BA, AB, BS)  Occupational History  . Occupation: Press photographer  Tobacco Use  . Smoking status: Never Smoker  . Smokeless tobacco: Never Used  Vaping Use  . Vaping Use: Never used  Substance and Sexual Activity  . Alcohol use: Yes    Alcohol/week: 3.0 standard drinks    Types: 3 Glasses of wine per week  . Drug use: Not Currently  . Sexual activity: Yes    Partners: Male    Birth control/protection: OCP  Other Topics Concern  . Not on file  Social History Narrative  . Not on file   Social Determinants of Health   Financial Resource Strain:   . Difficulty of Paying Living Expenses:   Food Insecurity:   . Worried About Charity fundraiser in the Last Year:   .  Ran Out of Food in the Last Year:   Transportation Needs:   . Film/video editor (Medical):   Marland Kitchen Lack of Transportation (Non-Medical):   Physical Activity:   . Days of Exercise per Week:   . Minutes of Exercise per Session:   Stress:   . Feeling of Stress :   Social Connections:   . Frequency of Communication with Friends and Family:   . Frequency of Social Gatherings with Friends and Family:   . Attends Religious Services:   . Active Member of Clubs or Organizations:   . Attends Archivist Meetings:   Marland Kitchen Marital Status:   Intimate Partner Violence:   . Fear of Current or Ex-Partner:   . Emotionally Abused:   Marland Kitchen Physically Abused:   . Sexually Abused:      Current Outpatient Medications:  .  Norethindrone Acetate-Ethinyl Estradiol (JUNEL 1.5/30) 1.5-30  MG-MCG tablet, TAKE 1 TABLET BY MOUTH CONTINOUSLY. ACTIVE PILL USE, DO NOT TAKE PLACEBO PILLS., Disp: 84 tablet, Rfl: 4     ROS:  Review of Systems  Constitutional: Positive for fatigue. Negative for fever and unexpected weight change.  Respiratory: Negative for cough, shortness of breath and wheezing.   Cardiovascular: Negative for chest pain, palpitations and leg swelling.  Gastrointestinal: Negative for blood in stool, constipation, diarrhea, nausea and vomiting.  Endocrine: Negative for cold intolerance, heat intolerance and polyuria.  Genitourinary: Negative for dyspareunia, dysuria, flank pain, frequency, genital sores, hematuria, menstrual problem, pelvic pain, urgency, vaginal bleeding, vaginal discharge and vaginal pain.  Musculoskeletal: Negative for back pain, joint swelling and myalgias.  Skin: Negative for rash.  Neurological: Negative for dizziness, syncope, light-headedness, numbness and headaches.  Hematological: Negative for adenopathy.  Psychiatric/Behavioral: Negative for agitation, confusion, sleep disturbance and suicidal ideas. The patient is not nervous/anxious.   BREAST: No symptoms   Objective: BP 114/70   Ht 5\' 3"  (1.6 m)   Wt 148 lb (67.1 kg)   BMI 26.22 kg/m    Physical Exam Constitutional:      Appearance: She is well-developed.  Genitourinary:     Vulva, vagina, cervix, uterus, right adnexa and left adnexa normal.     No vulval lesion or tenderness noted.     No vaginal discharge, erythema or tenderness.     No cervical polyp.     Uterus is not enlarged or tender.     No right or left adnexal mass present.     Right adnexa not tender.     Left adnexa not tender.  Neck:     Thyroid: No thyromegaly.  Cardiovascular:     Rate and Rhythm: Normal rate and regular rhythm.     Heart sounds: Normal heart sounds. No murmur heard.   Pulmonary:     Effort: Pulmonary effort is normal.     Breath sounds: Normal breath sounds.  Chest:     Breasts:          Right: No mass, nipple discharge, skin change or tenderness.        Left: No mass, nipple discharge, skin change or tenderness.  Abdominal:     Palpations: Abdomen is soft.     Tenderness: There is no abdominal tenderness. There is no guarding.  Musculoskeletal:        General: Normal range of motion.     Cervical back: Normal range of motion.  Neurological:     General: No focal deficit present.     Mental Status: She is alert and  oriented to person, place, and time.     Cranial Nerves: No cranial nerve deficit.  Skin:    General: Skin is warm and dry.  Psychiatric:        Mood and Affect: Mood normal.        Behavior: Behavior normal.        Thought Content: Thought content normal.        Judgment: Judgment normal.  Vitals reviewed.     Assessment/Plan: Encounter for annual routine gynecological examination  Encounter for surveillance of contraceptive pills - Plan: Norethindrone Acetate-Ethinyl Estradiol (JUNEL 1.5/30) 1.5-30 MG-MCG tablet; OCP RF.  Blood tests for routine general physical examination - Plan: Comprehensive metabolic panel, TSH + free T4, SARS-CoV-2 Semi-Quantitative Total Antibody, Spike  Thyroid disorder screening - Plan: TSH + free T4  Hair loss--check labs. If neg, question stress. Add biotin to MVI.   Encounter for screening laboratory testing for COVID-19 virus in asymptomatic patient - Plan: SARS-CoV-2 Semi-Quantitative Total Antibody, Spike; pt with covid 12/20, unsure if has AB, may do vaccine for pregnancy  Meds ordered this encounter  Medications  . Norethindrone Acetate-Ethinyl Estradiol (JUNEL 1.5/30) 1.5-30 MG-MCG tablet    Sig: TAKE 1 TABLET BY MOUTH CONTINOUSLY. ACTIVE PILL USE, DO NOT TAKE PLACEBO PILLS.    Dispense:  84 tablet    Refill:  4    Order Specific Question:   Supervising Provider    Answer:   Gae Dry [[id number]]             GYN counsel adequate intake of calcium and vitamin D, diet and exercise      F/U  Return in about 1 year (around 09/16/2020).  Wisdom Rickey B. Julliette Frentz, PA-C 09/17/2019 10:49 AM

## 2019-09-17 ENCOUNTER — Encounter: Payer: Self-pay | Admitting: Obstetrics and Gynecology

## 2019-09-17 ENCOUNTER — Other Ambulatory Visit: Payer: Self-pay

## 2019-09-17 ENCOUNTER — Ambulatory Visit (INDEPENDENT_AMBULATORY_CARE_PROVIDER_SITE_OTHER): Payer: 59 | Admitting: Obstetrics and Gynecology

## 2019-09-17 VITALS — BP 114/70 | Ht 63.0 in | Wt 148.0 lb

## 2019-09-17 DIAGNOSIS — Z0000 Encounter for general adult medical examination without abnormal findings: Secondary | ICD-10-CM | POA: Diagnosis not present

## 2019-09-17 DIAGNOSIS — Z01419 Encounter for gynecological examination (general) (routine) without abnormal findings: Secondary | ICD-10-CM | POA: Diagnosis not present

## 2019-09-17 DIAGNOSIS — Z3041 Encounter for surveillance of contraceptive pills: Secondary | ICD-10-CM | POA: Diagnosis not present

## 2019-09-17 DIAGNOSIS — Z1329 Encounter for screening for other suspected endocrine disorder: Secondary | ICD-10-CM

## 2019-09-17 DIAGNOSIS — L659 Nonscarring hair loss, unspecified: Secondary | ICD-10-CM

## 2019-09-17 DIAGNOSIS — Z20822 Contact with and (suspected) exposure to covid-19: Secondary | ICD-10-CM

## 2019-09-17 MED ORDER — NORETHINDRONE ACET-ETHINYL EST 1.5-30 MG-MCG PO TABS: ORAL_TABLET | ORAL | 4 refills | Status: DC

## 2019-09-18 ENCOUNTER — Telehealth: Payer: Self-pay

## 2019-09-18 LAB — COMPREHENSIVE METABOLIC PANEL
ALT: 13 IU/L (ref 0–32)
AST: 18 IU/L (ref 0–40)
Albumin/Globulin Ratio: 1.6 (ref 1.2–2.2)
Albumin: 4.1 g/dL (ref 3.9–5.0)
Alkaline Phosphatase: 43 IU/L — ABNORMAL LOW (ref 48–121)
BUN/Creatinine Ratio: 20 (ref 9–23)
BUN: 13 mg/dL (ref 6–20)
Bilirubin Total: 0.4 mg/dL (ref 0.0–1.2)
CO2: 24 mmol/L (ref 20–29)
Calcium: 9 mg/dL (ref 8.7–10.2)
Chloride: 102 mmol/L (ref 96–106)
Creatinine, Ser: 0.66 mg/dL (ref 0.57–1.00)
GFR calc Af Amer: 140 mL/min/{1.73_m2} (ref >59)
GFR calc non Af Amer: 121 mL/min/{1.73_m2} (ref >59)
Globulin, Total: 2.5 g/dL (ref 1.5–4.5)
Glucose: 75 mg/dL (ref 65–99)
Potassium: 3.9 mmol/L (ref 3.5–5.2)
Sodium: 139 mmol/L (ref 134–144)
Total Protein: 6.6 g/dL (ref 6.0–8.5)

## 2019-09-18 LAB — TSH+FREE T4
Free T4: 1.43 ng/dL (ref 0.82–1.77)
TSH: 0.546 u[IU]/mL (ref 0.450–4.500)

## 2019-09-18 LAB — SARS-COV-2 SEMI-QUANTITATIVE TOTAL ANTIBODY, SPIKE
SARS-CoV-2 Semi-Quant Total Ab: 22.5 U/mL (ref <0.8)
SARS-CoV-2 Spike Ab Interp: POSITIVE

## 2019-09-18 NOTE — Telephone Encounter (Signed)
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 Doesn't need to quarantine. I sent her results msg. AB levels are fairly low and  probably minimally protective for delta variant.

## 2019-09-18 NOTE — Telephone Encounter (Signed)
Patient inquiring about Covid antibody test. Patient states it looks like her antibodies are super high. She wants to clarify that it doesn't mean she is positive for Covid, just that she has antibodies against it. Should she be getting a antigen test or quarantine. [phone]

## 2019-09-18 NOTE — Telephone Encounter (Signed)
Patient aware.

## 2020-01-15 ENCOUNTER — Encounter: Payer: Self-pay | Admitting: Family Medicine

## 2020-01-15 ENCOUNTER — Other Ambulatory Visit: Payer: Self-pay

## 2020-01-15 ENCOUNTER — Ambulatory Visit (INDEPENDENT_AMBULATORY_CARE_PROVIDER_SITE_OTHER): Payer: 59 | Admitting: Family Medicine

## 2020-01-15 DIAGNOSIS — A084 Viral intestinal infection, unspecified: Secondary | ICD-10-CM | POA: Diagnosis not present

## 2020-01-15 MED ORDER — PROMETHAZINE HCL 12.5 MG PO TABS: 12.5000 mg | ORAL_TABLET | Freq: Three times a day (TID) | ORAL | 0 refills | Status: DC | PRN

## 2020-01-15 NOTE — Progress Notes (Signed)
MyChart Video Visit    Virtual Visit via Video Note   This visit type was conducted due to national recommendations for restrictions regarding the COVID-19 Pandemic (e.g. social distancing) in an effort to limit this patient's exposure and mitigate transmission in our community. This patient is at least at moderate risk for complications without adequate follow up. This format is felt to be most appropriate for this patient at this time. Physical exam was limited by quality of the video and audio technology used for the visit.   Patient location: Home Provider location: Office  I discussed the limitations of evaluation and management by telemedicine and the availability of in person appointments. The patient expressed understanding and agreed to proceed.  Patient: Melinda Mcdowell   DOB: 1990-06-08   29 y.o. Female  MRN: [id number] Visit Date: 01/15/2020  Today's healthcare provider: Vernie Murders, PA-C   Chief Complaint  Patient presents with  . Diarrhea   Subjective    Diarrhea  This is a new problem. The current episode started yesterday. The problem has been gradually worsening. The stool consistency is described as watery. The patient states that diarrhea awakens her from sleep. Associated symptoms include chills, headaches, myalgias and vomiting. There are no known risk factors. She has tried increased fluids for the symptoms. The treatment provided no relief.      No past medical history on file. Past Surgical History:  Procedure Laterality Date  . WISDOM TOOTH EXTRACTION     Social History   Tobacco Use  . Smoking status: Never Smoker  . Smokeless tobacco: Never Used  Vaping Use  . Vaping Use: Never used  Substance Use Topics  . Alcohol use: Yes    Alcohol/week: 3.0 standard drinks    Types: 3 Glasses of wine per week  . Drug use: Not Currently   Family History  Problem Relation Age of Onset  . Healthy Mother   . Ovarian cysts Mother   . Hypertension  Father   . Asthma Brother   . Clotting disorder Maternal Grandmother   . Stroke Maternal Grandmother   . Diabetes Maternal Grandmother   . Breast cancer Neg Hx   . Colon cancer Neg Hx   . Ovarian cancer Neg Hx   . Cervical cancer Neg Hx    No Known Allergies    Medications: Outpatient Medications Prior to Visit  Medication Sig  . Norethindrone Acetate-Ethinyl Estradiol (JUNEL 1.5/30) 1.5-30 MG-MCG tablet TAKE 1 TABLET BY MOUTH CONTINOUSLY. ACTIVE PILL USE, DO NOT TAKE PLACEBO PILLS.   No facility-administered medications prior to visit.    Review of Systems  Constitutional: Positive for chills.  Gastrointestinal: Positive for diarrhea and vomiting.  Musculoskeletal: Positive for myalgias.  Neurological: Positive for headaches.      Objective    There were no vitals taken for this visit.   Physical Exam: During telephonic interview, no apparent acute distress.     Assessment & Plan     1. Viral gastroenteritis Developed nausea, vomiting, diarrhea and chills yesterday afternoon. No further vomiting since 11 pm last night. Recommend bland diet with extra fluids (Gatorade). May use Imodium-AD prn diarrhea and add Phenergan for nausea. Rest at home and recheck prn. If worsening and unable to take medications, may need to go to an UC or ER for IV hydration over the weekend. - promethazine (PHENERGAN) 12.5 MG tablet; Take 1 tablet (12.5 mg total) by mouth every 8 (eight) hours as needed for nausea or vomiting.  Dispense:  20 tablet; Refill: 0   No follow-ups on file.     I discussed the assessment and treatment plan with the patient. The patient was provided an opportunity to ask questions and all were answered. The patient agreed with the plan and demonstrated an understanding of the instructions.   The patient was advised to call back or seek an in-person evaluation if the symptoms worsen or if the condition fails to improve as anticipated.  I provided 21 minutes of  non-face-to-face time during this encounter.  I, Jerek Meulemans, PA-C, have reviewed all documentation for this visit. The documentation on 01/15/20 for the exam, diagnosis, procedures, and orders are all accurate and complete.   Vernie Murders, PA-C [employer] [phone] (phone) [phone] (fax)  [place]

## 2020-01-16 ENCOUNTER — Telehealth: Payer: Self-pay

## 2020-01-16 NOTE — Telephone Encounter (Signed)
Pt called back asking if anyone would be able to call her back today about the diarrhea she is having.    CB#[phone]

## 2020-01-16 NOTE — Telephone Encounter (Signed)
Copied from [address] [id number]. Topic: General - Other >> Jan 16, 2020 10:25 AM Rainey Pines A wrote: Patient is still experiencing stomach cramps and diarrhea and want to know if this is normal in taking the medication that was prescirbed for it yesterday promethazine (PHENERGAN) 12.5 MG tablet. Patient would like callback from nurse Please advise

## 2020-01-19 NOTE — Telephone Encounter (Signed)
I believe this is a follow-up from your visit

## 2020-01-19 NOTE — Telephone Encounter (Signed)
No answer. Left message for her to call back if needed.

## 2020-02-26 ENCOUNTER — Encounter: Payer: Self-pay | Admitting: Family Medicine

## 2020-02-26 ENCOUNTER — Telehealth (INDEPENDENT_AMBULATORY_CARE_PROVIDER_SITE_OTHER): Payer: 59 | Admitting: Family Medicine

## 2020-02-26 VITALS — BP 126/88 | HR 85 | Ht 63.0 in | Wt 143.0 lb

## 2020-02-26 DIAGNOSIS — Z20822 Contact with and (suspected) exposure to covid-19: Secondary | ICD-10-CM

## 2020-02-26 NOTE — Patient Instructions (Signed)

## 2020-02-26 NOTE — Progress Notes (Signed)
MyChart Video Visit   Virtual Visit via Video Note   This visit type was conducted due to national recommendations for restrictions regarding the COVID-19 Pandemic (e.g. social distancing) in an effort to limit this patient's exposure and mitigate transmission in our community. This patient is at least at moderate risk for complications without adequate follow up. This format is felt to be most appropriate for this patient at this time. Physical exam was limited by quality of the video and audio technology used for the visit.   Patient location: home Provider location: home office Persons involved in the visit: patient, provider  I discussed the limitations of evaluation and management by telemedicine and the availability of in person appointments. The patient expressed understanding and agreed to proceed.  Patient: Melinda Mcdowell   DOB: Sep 05, 1990   30 y.o. Female  MRN: [id number] Visit Date: 02/26/2020  Today's healthcare provider: Lavon Paganini, MD   Chief Complaint  Patient presents with  . URI   Subjective    HPI HPI    URI    Associated symptoms inlclude cough, sore throat and swollen glands.  Recent episode started in the past 7 days.  The problem has been gradually improving since onset.  The temperature has been with in normal range.  Patient  is drinking plenty of fluids.  Past hisotry is significant for  no history of pneumonia or bronchitis.  Patient is not a smoker.       Last edited by Dorian Pod, CMA on 02/26/2020  9:04 AM. (History)      First day of symptoms 1/22 Let it go, because they were at Grandparents' funeral out of town. Thought it was due to the dry air due to wood stove in the home they stayed in. Then felt more fatigue and malaise a few days later. Cough and sore throat were the worst 2 days ago Husband sick with similar symptoms No fever, chills, dip in O2 sats Tried OTC Nyquil, dayquil, mucinex. Not on anything right now  No  covid test during this time. Not COVID vaccinated.  There are no problems to display for this patient.  Social History   Tobacco Use  . Smoking status: Never Smoker  . Smokeless tobacco: Never Used  Vaping Use  . Vaping Use: Never used  Substance Use Topics  . Alcohol use: Yes    Alcohol/week: 3.0 standard drinks    Types: 3 Glasses of wine per week  . Drug use: Not Currently   No Known Allergies    Medications: Outpatient Medications Prior to Visit  Medication Sig  . Norethindrone Acetate-Ethinyl Estradiol (JUNEL 1.5/30) 1.5-30 MG-MCG tablet TAKE 1 TABLET BY MOUTH CONTINOUSLY. ACTIVE PILL USE, DO NOT TAKE PLACEBO PILLS.  . [DISCONTINUED] promethazine (PHENERGAN) 12.5 MG tablet Take 1 tablet (12.5 mg total) by mouth every 8 (eight) hours as needed for nausea or vomiting.   No facility-administered medications prior to visit.    Review of Systems  Constitutional: Negative for appetite change, chills and fever.  HENT: Positive for sore throat. Negative for congestion, ear pain, postnasal drip, sinus pressure and sinus pain.   Respiratory: Positive for cough. Negative for shortness of breath and wheezing.   Cardiovascular: Negative for chest pain and palpitations.    Last metabolic panel Lab Results  Component Value Date   GLUCOSE 75 09/17/2019   NA 139 09/17/2019   K 3.9 09/17/2019   CL 102 09/17/2019   CO2 24 09/17/2019   BUN 13  09/17/2019   CREATININE 0.66 09/17/2019   GFRNONAA 121 09/17/2019   GFRAA 140 09/17/2019   CALCIUM 9.0 09/17/2019   PROT 6.6 09/17/2019   ALBUMIN 4.1 09/17/2019   LABGLOB 2.5 09/17/2019   AGRATIO 1.6 09/17/2019   BILITOT 0.4 09/17/2019   ALKPHOS 43 (L) 09/17/2019   AST 18 09/17/2019   ALT 13 09/17/2019      Objective    BP 126/88   Pulse 85   Ht 5\' 3"  (1.6 m)   Wt 143 lb (64.9 kg)   BMI 25.33 kg/m  BP Readings from Last 3 Encounters:  02/26/20 126/88  09/17/19 114/70  06/14/18 126/88   Wt Readings from Last 3 Encounters:   02/26/20 143 lb (64.9 kg)  09/17/19 148 lb (67.1 kg)  06/14/18 144 lb 3.2 oz (65.4 kg)      Physical Exam  Speaking in full sentences in NAD    Assessment & Plan     1. Suspected COVID-19 virus infection - symptoms c/w viral URI  - no evidence of strep pharyngitis, CAP, AOM, bacterial sinusitis, or other bacterial infection - concern for possible COVID19 infection - will send for outpatient testing - discussed need to quarantine 10 days from start of symptoms (or possibly 5 days with new CDC recommendations) and until fever-free for at least 24 hours - discussed symptomatic management, natural course, and return precautions   - Novel Coronavirus, NAA ([facility])    Return if symptoms worsen or fail to improve.     I discussed the assessment and treatment plan with the patient. The patient was provided an opportunity to ask questions and all were answered. The patient agreed with the plan and demonstrated an understanding of the instructions.   The patient was advised to call back or seek an in-person evaluation if the symptoms worsen or if the condition fails to improve as anticipated.   I provided 25 minutes of non-face-to-face time during this encounter.   I, Lavon Paganini, MD, have reviewed all documentation for this visit. The documentation on 02/26/20 for the exam, diagnosis, procedures, and orders are all accurate and complete.   B[place]alupo, Dionne Bucy, MD, MPH [facility]

## 2020-02-28 LAB — NOVEL CORONAVIRUS, NAA: SARS-CoV-2, NAA: DETECTED — AB

## 2020-02-28 LAB — SARS-COV-2, NAA 2 DAY TAT

## 2020-08-26 ENCOUNTER — Ambulatory Visit: Payer: Self-pay | Admitting: *Deleted

## 2020-08-26 NOTE — Telephone Encounter (Signed)
Reason for Disposition  Swelling is huge (e.g., more than 4 inches or 10 cm, spreads beyond wrist or ankle)  Answer Assessment - Initial Assessment Questions 1. TYPE: "What type of sting was it?" (bee, yellow jacket, etc.)      Yellow jacket 2. ONSET: "When did it occur?"      Tuesday evening 3. LOCATION: "Where is the sting located?"  "How many stings?"     3-L thigh, 1 L hand, 1 R finger 4. SWELLING SIZE: "How big is the swelling?" (e.g., inches or cm)     Yes- large area 5. REDNESS: "Is the area red or pink?" If Yes, ask: "What size is area of redness?" (e.g., inches or cm). "When did the redness start?"     Red area were swollen- leg area- 6-7 inches long 6. PAIN: "Is there any pain?" If Yes, ask: "How bad is it?"  (Scale 1-10; or mild, moderate, severe)     Only with scratching 7. ITCHING: "Is there any itching?" If Yes, ask: "How bad is it?"      Yes- moderate 8. RESPIRATORY DISTRESS: "Describe your breathing."     No problem breathing 9. PRIOR REACTIONS: "Have you had any severe allergic reactions to stings in the past?" if yes, ask: "What happened?"     No prior reaction 10. OTHER SYMPTOMS: "Do you have any other symptoms?" (e.g., abdominal pain, face or tongue swelling, new rash elsewhere, vomiting)       no 11. PREGNANCY: "Is there any chance you are pregnant?" "When was your last menstrual period?"       No- LMP- continuous birth control  Protocols used: Bee or Yellow Jacket Sting-A-AH

## 2020-08-26 NOTE — Telephone Encounter (Signed)
Patient is calling to report she got stung at least 5 times by yellow jackets. Patient reports she is having increased itching and pain- she has large red area on her thigh. Patient advised per protocol- attempted to call office- no answer. Advised patient would check and see if we could get her scheduled for tomorrow- she is willing to go out of office if needed.

## 2020-08-26 NOTE — Telephone Encounter (Signed)
LMTCB, [facility] Triage Nurse may give patient results  

## 2020-10-30 ENCOUNTER — Encounter: Payer: Self-pay | Admitting: Emergency Medicine

## 2020-10-30 ENCOUNTER — Ambulatory Visit
Admission: EM | Admit: 2020-10-30 | Discharge: 2020-10-30 | Disposition: A | Discharge status: Home / Self Care | Payer: 59

## 2020-10-30 ENCOUNTER — Other Ambulatory Visit: Payer: Self-pay

## 2020-10-30 DIAGNOSIS — J209 Acute bronchitis, unspecified: Secondary | ICD-10-CM

## 2020-10-30 MED ORDER — PREDNISONE 10 MG PO TABS: ORAL_TABLET | ORAL | 0 refills | Status: AC

## 2020-10-30 MED ORDER — AZITHROMYCIN 250 MG PO TABS: ORAL_TABLET | ORAL | 0 refills | Status: AC

## 2020-10-30 MED ORDER — ALBUTEROL SULFATE HFA 108 (90 BASE) MCG/ACT IN AERS: 1.0000 | INHALATION_SPRAY | Freq: Four times a day (QID) | RESPIRATORY_TRACT | 0 refills | Status: AC | PRN

## 2020-10-30 NOTE — ED Provider Notes (Addendum)
CHIEF COMPLAINT:   Chief Complaint  Patient presents with   URI     SUBJECTIVE/HPI:  HPI A very pleasant 30 y.o.Female presents today with sore throat that started on 10/22/2020.  Patient reports that she has since developed a cough, fatigue and congestion.  Patient reports that cough is worse over the last 3 days.  Last night she reports coughing up and blowing out thick green mucus. Patient does not report any shortness of breath, chest pain, palpitations, visual changes, weakness, tingling, headache, nausea, vomiting, diarrhea, fever, chills.   has no past medical history on file.  ROS:  Review of Systems See Subjective/HPI Medications, Allergies and Problem List personally reviewed in Epic today OBJECTIVE:   Vitals:   10/30/20 1413  BP: (!) 136/91  Pulse: 84  Resp: 16  Temp: 98.9 F (37.2 C)  SpO2: 97%    Physical Exam   General: Appears well-developed and well-nourished. No acute distress.  HEENT Head: Normocephalic and atraumatic.  No frontal, maxillary sinus and nasal bridge tenderness noted to palpation. Ears: Hearing grossly intact, no drainage or visible deformity.  Nose: No nasal deviation.   Mouth/Throat: No stridor or tracheal deviation.  Mildly erythematous posterior pharynx noted with clear drainage present.  No white patchy exudate noted. Eyes: Conjunctivae and EOM are normal. No eye drainage or scleral icterus bilaterally.  Neck: Normal range of motion, neck is supple.  Cardiovascular: Normal rate. Regular rhythm; no murmurs, gallops, or rubs.  Pulm/Chest: No respiratory distress. Breath sounds normal bilaterally without wheezes, rhonchi, or rales. Constant forceful cough noted. Neurological: Alert and oriented to person, place, and time.  Skin: Skin is warm and dry.  No rashes, lesions, abrasions or bruising noted to skin.   Psychiatric: Normal mood, affect, behavior, and thought content.   Vital signs and nursing note reviewed.   Patient stable and  cooperative with examination. PROCEDURES:    LABS/X-RAYS/EKG/MEDS:   No results found for any visits on 10/30/20.  MEDICAL DECISION MAKING:   Patient presents with sore throat that started on 10/22/2020.  Patient reports that she has since developed a cough, fatigue and congestion.  Patient reports that cough is worse over the last 3 days.  Last night she reports coughing up and blowing out thick green mucus. Patient does not report any shortness of breath, chest pain, palpitations, visual changes, weakness, tingling, headache, nausea, vomiting, diarrhea, fever, chills.  Given symptoms along with assessment findings, likely acute bronchitis.  Rx'd a low-dose prednisone taper to the patient's preferred pharmacy along with a ProAir inhaler.  Did give the patient a fold and hold prescription for azithromycin to take after the next 1 week if symptoms do not improve or acutely worsen.  Advised about home treatment and care to include rest, fluids, Tylenol, ibuprofen, Mucinex.  Advised to return to clinic for new onset fever, difficulty breathing, chest pain, symptoms lasting longer than 3 to 4 weeks or bloody sputum.  Patient verbalized understanding and agreed with treatment plan.  Patient stable upon discharge. ASSESSMENT/PLAN:  1. Acute bronchitis, unspecified organism - albuterol (VENTOLIN HFA) 108 (90 Base) MCG/ACT inhaler; Inhale 1-2 puffs into the lungs every 6 (six) hours as needed (cough).  Dispense: 6.7 g; Refill: 0 - predniSONE (DELTASONE) 10 MG tablet; Take 3 tablets (30 mg total) by mouth daily with breakfast for 2 days, THEN 2 tablets (20 mg total) daily with breakfast for 2 days, THEN 1 tablet (10 mg total) daily with breakfast for 2 days.  Dispense: 12 tablet;  Refill: 0 Instructions about new medications and side effects provided.  Plan:   Discharge Instructions      I have provided you with a fold and hold prescription for azithromycin.  If your symptoms do not improve over the  next 1 week or acutely worsen I would go ahead and fill this prescription to start taking this medication.  Take prednisone and use your albuterol inhaler as prescribed.  Rest, push lots of fluids (especially water), and utilize supportive care for symptoms. You may take take acetaminophen (Tylenol) every 4-6 hours or ibuprofen every 6-8 hours for muscle pain, joint pain, headaches. Mucinex (guaifenesin) may be taken over the counter for cough as needed and can loosen phlegm. Please read the instructions and take as directed. Saline nasal sprays to rinse congestion can help as well. Warm tea with lemon and honey can sooth sore throat and cough, as can cough drops.  Take Mucinex for cough.   Return to clinic for new-onset fever, difficulty breathing, chest pain, symptoms lasting >3 to 4 weeks, or bloody sputum.          Serafina Royals, [place] 10/30/20 [place]    Serafina Royals, [place] 10/30/20 [phone]

## 2020-10-30 NOTE — Discharge Instructions (Signed)
I have provided you with a fold and hold prescription for azithromycin.  If your symptoms do not improve over the next 1 week or acutely worsen I would go ahead and fill this prescription to start taking this medication.  Take prednisone and use your albuterol inhaler as prescribed.  Rest, push lots of fluids (especially water), and utilize supportive care for symptoms. You may take take acetaminophen (Tylenol) every 4-6 hours or ibuprofen every 6-8 hours for muscle pain, joint pain, headaches. Mucinex (guaifenesin) may be taken over the counter for cough as needed and can loosen phlegm. Please read the instructions and take as directed. Saline nasal sprays to rinse congestion can help as well. Warm tea with lemon and honey can sooth sore throat and cough, as can cough drops.  Take Mucinex for cough.   Return to clinic for new-onset fever, difficulty breathing, chest pain, symptoms lasting >3 to 4 weeks, or bloody sputum.

## 2020-10-30 NOTE — ED Triage Notes (Signed)
10/22/2020 started with sore throat.  Since then has developed a cough, tired, congestion.  Cough is wrse over the last 3 days. Last night has started coughing and blowing green, thick secreations

## 2020-11-18 ENCOUNTER — Other Ambulatory Visit: Payer: Self-pay | Admitting: Obstetrics and Gynecology

## 2020-11-18 DIAGNOSIS — Z3041 Encounter for surveillance of contraceptive pills: Secondary | ICD-10-CM

## 2020-11-25 ENCOUNTER — Other Ambulatory Visit: Payer: Self-pay

## 2020-11-25 DIAGNOSIS — Z3041 Encounter for surveillance of contraceptive pills: Secondary | ICD-10-CM

## 2020-11-25 MED ORDER — NORETHINDRONE ACET-ETHINYL EST 1.5-30 MG-MCG PO TABS: ORAL_TABLET | ORAL | 0 refills | Status: DC

## 2020-11-25 NOTE — Telephone Encounter (Signed)
Pt calling; has sched appt 01/10/21; will run out of bc before them; needs refill.  [phone]  Pt aware refill eRx'd.

## 2021-01-10 ENCOUNTER — Encounter: Payer: Self-pay | Admitting: Obstetrics and Gynecology

## 2021-01-10 ENCOUNTER — Ambulatory Visit (INDEPENDENT_AMBULATORY_CARE_PROVIDER_SITE_OTHER): Payer: 59 | Admitting: Obstetrics and Gynecology

## 2021-01-10 ENCOUNTER — Other Ambulatory Visit (HOSPITAL_COMMUNITY)
Admission: RE | Admit: 2021-01-10 | Discharge: 2021-01-10 | Disposition: A | Discharge status: Home / Self Care | Payer: 59 | Source: Ambulatory Visit | Attending: Obstetrics and Gynecology | Admitting: Obstetrics and Gynecology

## 2021-01-10 ENCOUNTER — Other Ambulatory Visit: Payer: Self-pay

## 2021-01-10 VITALS — BP 100/70 | Ht 63.0 in | Wt 142.0 lb

## 2021-01-10 DIAGNOSIS — Z3169 Encounter for other general counseling and advice on procreation: Secondary | ICD-10-CM | POA: Diagnosis not present

## 2021-01-10 DIAGNOSIS — Z23 Encounter for immunization: Secondary | ICD-10-CM

## 2021-01-10 DIAGNOSIS — Z124 Encounter for screening for malignant neoplasm of cervix: Secondary | ICD-10-CM

## 2021-01-10 DIAGNOSIS — Z01419 Encounter for gynecological examination (general) (routine) without abnormal findings: Secondary | ICD-10-CM

## 2021-01-10 NOTE — Patient Instructions (Signed)
I value your feedback and you entrusting us with your care. If you get a [facility] patient survey, I would appreciate you taking the time to let us know about your experience today. Thank you! ? ? ?

## 2021-01-10 NOTE — Progress Notes (Signed)
PCP:  [name], MD   Chief Complaint  Patient presents with   Gynecologic Exam    No concerns     HPI:      [name] is a 30 y.o. G0P0000 whose LMP was No LMP recorded. (Menstrual status: Oral contraceptives)., presents today for her annual examination.  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 Menses were irregular during adolescence. Dysmenorrhea none. She does not have intermenstrual bleeding unless she misses a pill.   Sex activity: single partner, contraception - OCP, wants to conceive. Taking MVI. Last Pap: 02/04/18  Results were: no abnormalities  Hx of STDs: none Hx of BV in past  There is no FH of breast cancer. There is no FH of ovarian cancer. The patient does do self-breast exams.  Tobacco use: The patient denies current or previous tobacco use. Alcohol use: social drinker No drug use.  Exercise: very active  She does get adequate calcium but not Vitamin D in her diet.  Past Medical History:  Diagnosis Date   No pertinent past medical history     Past Surgical History:  Procedure Laterality Date   WISDOM TOOTH EXTRACTION      Family History  Problem Relation Age of Onset   Healthy Mother    Ovarian cysts Mother    Hypertension Father    Asthma Brother    Clotting disorder Maternal Grandmother    Stroke Maternal Grandmother    Diabetes Maternal Grandmother    Breast cancer Neg Hx    Colon cancer Neg Hx    Ovarian cancer Neg Hx    Cervical cancer Neg Hx     Social History   Socioeconomic History   Marital status: Married    Spouse name: Tomasita Crumble   Number of children: 0   Years of education: 16   Highest education level: Bachelor's degree (e.g., BA, AB, BS)  Occupational History   Occupation: Scientist, research (medical) rep  Tobacco Use   Smoking status: Never   Smokeless tobacco: Never  Vaping Use   Vaping Use: Never used  Substance and Sexual Activity    Alcohol use: Yes    Alcohol/week: 3.0 standard drinks    Types: 3 Glasses of wine per week   Drug use: Not Currently   Sexual activity: Yes    Partners: Male    Birth control/protection: OCP  Other Topics Concern   Not on file  Social History Narrative   Not on file   Social Determinants of Health   Financial Resource Strain: Not on file  Food Insecurity: Not on file  Transportation Needs: Not on file  Physical Activity: Not on file  Stress: Not on file  Social Connections: Not on file  Intimate Partner Violence: Not on file     Current Outpatient Medications:    albuterol (VENTOLIN HFA) 108 (90 Base) MCG/ACT inhaler, Inhale 1-2 puffs into the lungs every 6 (six) hours as needed (cough)., Disp: 6.7 g, Rfl: 0   Norethindrone Acetate-Ethinyl Estradiol (JUNEL 1.5/30) 1.5-30 MG-MCG tablet, TAKE 1 TABLET BY MOUTH CONTINOUSLY. ACTIVE PILL USE, DO NOT TAKE PLACEBO PILLS., Disp: 84 tablet, Rfl: 0     ROS:  Review of Systems  Constitutional:  Negative for fatigue, fever and unexpected weight change.  Respiratory:  Negative for cough, shortness of breath and wheezing.   Cardiovascular:  Negative for chest pain, palpitations and leg swelling.  Gastrointestinal:  Negative  for blood in stool, constipation, diarrhea, nausea and vomiting.  Endocrine: Negative for cold intolerance, heat intolerance and polyuria.  Genitourinary:  Negative for dyspareunia, dysuria, flank pain, frequency, genital sores, hematuria, menstrual problem, pelvic pain, urgency, vaginal bleeding, vaginal discharge and vaginal pain.  Musculoskeletal:  Negative for back pain, joint swelling and myalgias.  Skin:  Negative for rash.  Neurological:  Negative for dizziness, syncope, light-headedness, numbness and headaches.  Hematological:  Negative for adenopathy.  Psychiatric/Behavioral:  Negative for agitation, confusion, sleep disturbance and suicidal ideas. The patient is not nervous/anxious.  BREAST: No  symptoms   Objective: BP 100/70   Ht 5\' 3"  (1.6 m)   Wt 142 lb (64.4 kg)   BMI 25.15 kg/m    Physical Exam Constitutional:      Appearance: She is well-developed.  Genitourinary:     Vulva normal.     Right Labia: No rash, tenderness or lesions.    Left Labia: No tenderness, lesions or rash.    No vaginal discharge, erythema or tenderness.      Right Adnexa: not tender and no mass present.    Left Adnexa: not tender and no mass present.    No cervical friability or polyp.     Uterus is not enlarged or tender.  Breasts:    Right: No mass, nipple discharge, skin change or tenderness.     Left: No mass, nipple discharge, skin change or tenderness.  Neck:     Thyroid: No thyromegaly.  Cardiovascular:     Rate and Rhythm: Normal rate and regular rhythm.     Heart sounds: Normal heart sounds. No murmur heard. Pulmonary:     Effort: Pulmonary effort is normal.     Breath sounds: Normal breath sounds.  Abdominal:     Palpations: Abdomen is soft.     Tenderness: There is no abdominal tenderness. There is no guarding or rebound.  Musculoskeletal:        General: Normal range of motion.     Cervical back: Normal range of motion.  Lymphadenopathy:     Cervical: No cervical adenopathy.  Neurological:     General: No focal deficit present.     Mental Status: She is alert and oriented to person, place, and time.     Cranial Nerves: No cranial nerve deficit.  Skin:    General: Skin is warm and dry.  Psychiatric:        Mood and Affect: Mood normal.        Behavior: Behavior normal.        Thought Content: Thought content normal.        Judgment: Judgment normal.  Vitals reviewed.    Assessment/Plan: Encounter for annual routine gynecological examination  Cervical cancer screening - Plan: Cytology - PAP  Pre-conception counseling--start PNVs, f/u prn NOB            GYN counsel adequate intake of calcium and vitamin D, diet and exercise     F/U  Return in about 1  year (around 01/10/2022).  Kinzey Sheriff B. Donna Snooks, PA-C 01/10/2021 4:42 PM

## 2021-01-13 LAB — CYTOLOGY - PAP
Diagnosis: NEGATIVE
Diagnosis: REACTIVE

## 2021-02-15 ENCOUNTER — Other Ambulatory Visit: Payer: Self-pay | Admitting: Obstetrics and Gynecology

## 2021-02-15 DIAGNOSIS — Z3041 Encounter for surveillance of contraceptive pills: Secondary | ICD-10-CM

## 2021-03-23 ENCOUNTER — Ambulatory Visit: Payer: Self-pay | Admitting: *Deleted

## 2021-03-23 ENCOUNTER — Encounter: Payer: Self-pay | Admitting: Obstetrics and Gynecology

## 2021-03-23 ENCOUNTER — Encounter: Payer: Self-pay | Admitting: Family Medicine

## 2021-03-23 NOTE — Telephone Encounter (Signed)
Please advise. Patient does not feel she needs to come in for an appt. She states is healthy and does not see the need for an appt.

## 2021-03-23 NOTE — Telephone Encounter (Signed)
Pt heard there is a medication for elevation sickness/ pt is going to [facility] Friday afternoon and wants to speak with someone about getting an RX for a medication to help with nausea / she was in [place] last week and had nausea and a headache the entire time/  please advise       Pt states she leaves Friday for trip to [place] to ski. States in [place] last week she was nauseated and had headache entire trip. Does not know name of med but "Heard there was something she could take to prevent sickness." States will stay hydrated but would like med. If appropriate [facility] on [address] CB# [phone] Reason for Disposition  [1] Caller has NON-URGENT medicine question about med that PCP prescribed AND [2] triager unable to answer question  Answer Assessment - Initial Assessment Questions 1. NAME of MEDICATION: "What medicine are you calling about?"     Unsure of name "Whatever she would recommend for nausea and headache, elevation sickness." 2. QUESTION: "What is your question?" (e.g., double dose of medicine, side effect)     Med for trip to [facility]  Protocols used: Medication Question Call-A-AH

## 2021-03-24 NOTE — Telephone Encounter (Signed)
Lmtcb to schedule appt either in person or virtual. PEC please advise and schedule appt when patient calls back. CRM created.

## 2021-03-24 NOTE — Telephone Encounter (Signed)
Sent message through [facility] for patient to call to schedule an appt either in person or virtual.

## 2021-03-24 NOTE — Telephone Encounter (Signed)
Can offer virtual visit, but would need visit of some kind to discuss a new med and check drug interactions, etc.

## 2021-03-25 NOTE — Telephone Encounter (Signed)
Patient is calling to follow up on if medication was sent in. Please advise patient would like a call please!

## 2021-03-26 MED ORDER — ACETAZOLAMIDE 125 MG PO TABS: 125.0000 mg | ORAL_TABLET | Freq: Two times a day (BID) | ORAL | 0 refills | Status: DC

## 2021-04-14 ENCOUNTER — Encounter: Payer: Self-pay | Admitting: Obstetrics and Gynecology

## 2021-04-14 NOTE — Telephone Encounter (Signed)
Pt is scheduled for a lab appt on 3/21.  She was given the number for centralized scheduling to schedule the ultrasound since we do not have any available appts.   ?

## 2021-04-18 ENCOUNTER — Telehealth: Payer: Self-pay

## 2021-04-18 NOTE — Telephone Encounter (Signed)
Please contact patient to schedule ultrasound at [facility]. Called and left voicemail for patient to call back to be scheduled.  [facility] to call with results

## 2021-04-19 ENCOUNTER — Other Ambulatory Visit: Payer: Self-pay

## 2021-04-19 ENCOUNTER — Other Ambulatory Visit: Payer: 59

## 2021-04-19 DIAGNOSIS — N920 Excessive and frequent menstruation with regular cycle: Secondary | ICD-10-CM

## 2021-04-20 LAB — CBC WITH DIFFERENTIAL/PLATELET
Basophils Absolute: 0 10*3/uL (ref 0.0–0.2)
Basos: 1 %
EOS (ABSOLUTE): 0.2 10*3/uL (ref 0.0–0.4)
Eos: 3 %
Hematocrit: 40.4 % (ref 34.0–46.6)
Hemoglobin: 13.6 g/dL (ref 11.1–15.9)
Immature Grans (Abs): 0 10*3/uL (ref 0.0–0.1)
Immature Granulocytes: 0 %
Lymphocytes Absolute: 1.5 10*3/uL (ref 0.7–3.1)
Lymphs: 30 %
MCH: 31.1 pg (ref 26.6–33.0)
MCHC: 33.7 g/dL (ref 31.5–35.7)
MCV: 92 fL (ref 79–97)
Monocytes Absolute: 0.4 10*3/uL (ref 0.1–0.9)
Monocytes: 8 %
Neutrophils Absolute: 3 10*3/uL (ref 1.4–7.0)
Neutrophils: 58 %
Platelets: 236 10*3/uL (ref 150–450)
RBC: 4.37 x10E6/uL (ref 3.77–5.28)
RDW: 11.9 % (ref 11.7–15.4)
WBC: 5.1 10*3/uL (ref 3.4–10.8)

## 2021-05-05 ENCOUNTER — Ambulatory Visit (INDEPENDENT_AMBULATORY_CARE_PROVIDER_SITE_OTHER): Payer: 59

## 2021-05-05 ENCOUNTER — Other Ambulatory Visit: Payer: Self-pay | Admitting: Obstetrics and Gynecology

## 2021-05-05 DIAGNOSIS — N920 Excessive and frequent menstruation with regular cycle: Secondary | ICD-10-CM

## 2021-05-23 ENCOUNTER — Telehealth: Payer: 59 | Admitting: Physician Assistant

## 2021-05-23 ENCOUNTER — Ambulatory Visit: Payer: Self-pay

## 2021-05-23 ENCOUNTER — Encounter: Payer: Self-pay | Admitting: Family Medicine

## 2021-05-23 DIAGNOSIS — J209 Acute bronchitis, unspecified: Secondary | ICD-10-CM | POA: Diagnosis not present

## 2021-05-23 MED ORDER — AZITHROMYCIN 250 MG PO TABS
ORAL_TABLET | ORAL | 0 refills | Status: AC
Start: 2021-05-23 — End: 2021-05-28

## 2021-05-23 MED ORDER — BENZONATATE 100 MG PO CAPS: 100.0000 mg | ORAL_CAPSULE | Freq: Three times a day (TID) | ORAL | 0 refills | Status: DC | PRN

## 2021-05-23 NOTE — Patient Instructions (Signed)
?Curlene Dolphin, thank you for joining Leeanne Rio, PA-C for today's virtual visit.  While this provider is not your primary care provider (PCP), if your PCP is located in our provider database this encounter information will be shared with them immediately following your visit. ? ?Consent: ?(Patient) Melinda Mcdowell provided verbal consent for this virtual visit at the beginning of the encounter. ? ?Current Medications: ? ?Current Outpatient Medications:  ?  azithromycin (ZITHROMAX) 250 MG tablet, Take 2 tablets on day 1, then 1 tablet daily on days 2 through 5, Disp: 6 tablet, Rfl: 0 ?  benzonatate (TESSALON) 100 MG capsule, Take 1 capsule (100 mg total) by mouth 3 (three) times daily as needed for cough., Disp: 30 capsule, Rfl: 0 ?  acetaZOLAMIDE (DIAMOX) 125 MG tablet, Take 1 tablet (125 mg total) by mouth 2 (two) times daily., Disp: 10 tablet, Rfl: 0 ?  albuterol (VENTOLIN HFA) 108 (90 Base) MCG/ACT inhaler, Inhale 1-2 puffs into the lungs every 6 (six) hours as needed (cough)., Disp: 6.7 g, Rfl: 0 ?  Norethindrone Acetate-Ethinyl Estradiol (JUNEL 1.5/30) 1.5-30 MG-MCG tablet, TAKE 1 TABLET BY MOUTH CONTINOUSLY. ACTIVE PILL USE, DO NOT TAKE PLACEBO PILLS., Disp: 84 tablet, Rfl: 0  ? ?Medications ordered in this encounter:  ?Meds ordered this encounter  ?Medications  ? azithromycin (ZITHROMAX) 250 MG tablet  ?  Sig: Take 2 tablets on day 1, then 1 tablet daily on days 2 through 5  ?  Dispense:  6 tablet  ?  Refill:  0  ?  Order Specific Question:   Supervising Provider  ?  Answer:   Noemi Chapel [[id number]]  ? benzonatate (TESSALON) 100 MG capsule  ?  Sig: Take 1 capsule (100 mg total) by mouth 3 (three) times daily as needed for cough.  ?  Dispense:  30 capsule  ?  Refill:  0  ?  Order Specific Question:   Supervising Provider  ?  Answer:   Noemi Chapel [[id number]]  ?  ? ?*If you need refills on other medications prior to your next appointment, please contact your pharmacy* ? ?Follow-Up: ?Call back or seek an  in-person evaluation if the symptoms worsen or if the condition fails to improve as anticipated. ? ?Other Instructions ?Take antibiotic (Azithromycin) as directed.  Increase fluids.  Get plenty of rest. Use Mucinex for congestion. Use the Tessalon for cough. Ok to restart the albuterol if needed. Take a daily probiotic (I recommend Align or Culturelle, but even Activia Yogurt may be beneficial).  A humidifier placed in the bedroom may offer some relief for a dry, scratchy throat of nasal irritation.  Read information below on acute bronchitis. Please call or return to clinic if symptoms are not improving. ? ?Acute Bronchitis ?Bronchitis is when the airways that extend from the windpipe into the lungs get red, puffy, and painful (inflamed). Bronchitis often causes thick spit (mucus) to develop. This leads to a cough. A cough is the most common symptom of bronchitis. ?In acute bronchitis, the condition usually begins suddenly and goes away over time (usually in 2 weeks). Smoking, allergies, and asthma can make bronchitis worse. Repeated episodes of bronchitis may cause more lung problems. ? ?HOME CARE ?Rest. ?Drink enough fluids to keep your pee (urine) clear or pale yellow (unless you need to limit fluids as told by your doctor). ?Only take over-the-counter or prescription medicines as told by your doctor. ?Avoid smoking and secondhand smoke. These can make bronchitis worse. If you are a smoker, think about  using nicotine gum or skin patches. Quitting smoking will help your lungs heal faster. ?Reduce the chance of getting bronchitis again by: ?Washing your hands often. ?Avoiding people with cold symptoms. ?Trying not to touch your hands to your mouth, nose, or eyes. ?Follow up with your doctor as told. ? ?GET HELP IF: ?Your symptoms do not improve after 1 week of treatment. Symptoms include: ?Cough. ?Fever. ?Coughing up thick spit. ?Body aches. ?Chest congestion. ?Chills. ?Shortness of breath. ?Sore throat. ? ?GET  HELP RIGHT AWAY IF:  ?You have an increased fever. ?You have chills. ?You have severe shortness of breath. ?You have bloody thick spit (sputum). ?You throw up (vomit) often. ?You lose too much body fluid (dehydration). ?You have a severe headache. ?You faint. ? ?MAKE SURE YOU:  ?Understand these instructions. ?Will watch your condition. ?Will get help right away if you are not doing well or get worse. ?Document Released: 07/05/2007 Document Revised: 09/18/2012 Document Reviewed: 07/09/2012 ?ExitCare? Patient Information ?2015 [employer]. This information is not intended to replace advice given to you by your health care provider. Make sure you discuss any questions you have with your health care provider. ? ? ? ?If you have been instructed to have an in-person evaluation today at a local Urgent Care facility, please use the link below. It will take you to a list of all of our available [facility], including address, phone number and hours of operation. Please do not delay care.  ?[facility] ? ?If you or a family member do not have a primary care provider, use the link below to schedule a visit and establish care. When you choose a [facility] primary care physician or advanced practice provider, you gain a long-term partner in health. ?Find a Primary Care Provider ? ?Learn more about [facility]'s in-office and virtual care options: ?[facility]  ?

## 2021-05-23 NOTE — Progress Notes (Signed)
?Virtual Visit Consent  ? ?Curlene Dolphin, you are scheduled for a virtual visit with a [facility] provider today.   ?  ?Just as with appointments in the office, your consent must be obtained to participate.  Your consent will be active for this visit and any virtual visit you may have with one of our providers in the next 365 days.   ?  ?If you have a MyChart account, a copy of this consent can be sent to you electronically.  All virtual visits are billed to your insurance company just like a traditional visit in the office.   ? ?As this is a virtual visit, video technology does not allow for your provider to perform a traditional examination.  This may limit your provider's ability to fully assess your condition.  If your provider identifies any concerns that need to be evaluated in person or the need to arrange testing (such as labs, EKG, etc.), we will make arrangements to do so.   ?  ?Although advances in technology are sophisticated, we cannot ensure that it will always work on either your end or our end.  If the connection with a video visit is poor, the visit may have to be switched to a telephone visit.  With either a video or telephone visit, we are not always able to ensure that we have a secure connection.    ? ?Also, by engaging in this virtual visit, you consent to the provision of healthcare. Additionally, you authorize for your insurance to be billed (if applicable) for the services provided during this visit.  ? ?I need to obtain your verbal consent now.   Are you willing to proceed with your visit today?  ?  ?Ellysa Kobrin has provided verbal consent on 05/23/2021 for a virtual visit (video or telephone). ?  ?Leeanne Rio, PA-C  ? ?Date: 05/23/2021 10:33 AM ? ? ?Virtual Visit via Video Note  ? ?ILeeanne Rio, connected with  Mulan Adan  ([id number], 1990-11-28) on 05/23/21 at 10:15 AM EDT by a video-enabled telemedicine application and verified that I am speaking with the correct  person using two identifiers. ? ?Location: ?Patient: Virtual Visit Location Patient: Home ?Provider: Virtual Visit Location Provider: Home Office ?  ?I discussed the limitations of evaluation and management by telemedicine and the availability of in person appointments. The patient expressed understanding and agreed to proceed.   ? ?History of Present Illness: ?Melinda Mcdowell is a 31 y.o. who identifies as a female who was assigned female at birth, and is being seen today for URI symptoms starting about Thursday of last week and progressively worsening since that time. Initially with chest congestion, head congestion and cough.  Denies fever, chills or body aches. Main symptoms now are head congestion and cough. Cough is hoarse and has become productive of thick yellow sputum. Denies sinus pain, ear pain or tooth pain. Has an interview for a very important career opportunity. Was around a friend last week who later told her he had a cold. Has been taking Mucinex-fast OTC.  ? ?At home COVID test which was negative. ? ?HPI: HPI  ?Problems: There are no problems to display for this patient. ?  ?Allergies: No Known Allergies ?Medications:  ?Current Outpatient Medications:  ?  azithromycin (ZITHROMAX) 250 MG tablet, Take 2 tablets on day 1, then 1 tablet daily on days 2 through 5, Disp: 6 tablet, Rfl: 0 ?  benzonatate (TESSALON) 100 MG capsule, Take 1 capsule (100 mg total)  by mouth 3 (three) times daily as needed for cough., Disp: 30 capsule, Rfl: 0 ?  acetaZOLAMIDE (DIAMOX) 125 MG tablet, Take 1 tablet (125 mg total) by mouth 2 (two) times daily., Disp: 10 tablet, Rfl: 0 ?  albuterol (VENTOLIN HFA) 108 (90 Base) MCG/ACT inhaler, Inhale 1-2 puffs into the lungs every 6 (six) hours as needed (cough)., Disp: 6.7 g, Rfl: 0 ?  Norethindrone Acetate-Ethinyl Estradiol (JUNEL 1.5/30) 1.5-30 MG-MCG tablet, TAKE 1 TABLET BY MOUTH CONTINOUSLY. ACTIVE PILL USE, DO NOT TAKE PLACEBO PILLS., Disp: 84 tablet, Rfl:  0 ? ?Observations/Objective: ?Patient is well-developed, well-nourished in no acute distress.  ?Resting comfortably at home.  ?Head is normocephalic, atraumatic.  ?No labored breathing. ?Speech is clear and coherent with logical content.  ?Patient is alert and oriented at baseline.  ? ?Assessment and Plan: ?1. Acute bronchitis, unspecified organism ?- azithromycin (ZITHROMAX) 250 MG tablet; Take 2 tablets on day 1, then 1 tablet daily on days 2 through 5  Dispense: 6 tablet; Refill: 0 ?- benzonatate (TESSALON) 100 MG capsule; Take 1 capsule (100 mg total) by mouth 3 (three) times daily as needed for cough.  Dispense: 30 capsule; Refill: 0 ? ?Rx Azithromycin.  Increase fluids.  Rest.  Saline nasal spray.  Probiotic.  Mucinex as directed.  Humidifier in bedroom. Tessalon per orders. Ok to restart Albuterol.  Call or return to clinic if symptoms are not improving. ? ? ?Follow Up Instructions: ?I discussed the assessment and treatment plan with the patient. The patient was provided an opportunity to ask questions and all were answered. The patient agreed with the plan and demonstrated an understanding of the instructions.  A copy of instructions were sent to the patient via MyChart unless otherwise noted below.  ? ?The patient was advised to call back or seek an in-person evaluation if the symptoms worsen or if the condition fails to improve as anticipated. ? ?Time:  ?I spent 10 minutes with the patient via telehealth technology discussing the above problems/concerns.   ? ?Leeanne Rio, PA-C ?

## 2021-07-10 ENCOUNTER — Observation Stay
Admission: EM | Admit: 2021-07-10 | Discharge: 2021-07-11 | Disposition: A | Discharge status: Home / Self Care | Payer: 59 | Attending: Surgery | Admitting: Surgery

## 2021-07-10 ENCOUNTER — Emergency Department: Payer: 59

## 2021-07-10 ENCOUNTER — Other Ambulatory Visit: Payer: Self-pay

## 2021-07-10 ENCOUNTER — Encounter: Payer: Self-pay | Admitting: Emergency Medicine

## 2021-07-10 ENCOUNTER — Telehealth: Payer: 59 | Admitting: Nurse Practitioner

## 2021-07-10 DIAGNOSIS — C7A1 Malignant poorly differentiated neuroendocrine tumors: Secondary | ICD-10-CM | POA: Diagnosis not present

## 2021-07-10 DIAGNOSIS — Z331 Pregnant state, incidental: Secondary | ICD-10-CM | POA: Diagnosis not present

## 2021-07-10 DIAGNOSIS — A084 Viral intestinal infection, unspecified: Secondary | ICD-10-CM

## 2021-07-10 DIAGNOSIS — R1013 Epigastric pain: Secondary | ICD-10-CM | POA: Diagnosis present

## 2021-07-10 DIAGNOSIS — Z79899 Other long term (current) drug therapy: Secondary | ICD-10-CM | POA: Insufficient documentation

## 2021-07-10 DIAGNOSIS — K3580 Unspecified acute appendicitis: Secondary | ICD-10-CM | POA: Diagnosis not present

## 2021-07-10 DIAGNOSIS — K37 Unspecified appendicitis: Secondary | ICD-10-CM | POA: Diagnosis present

## 2021-07-10 DIAGNOSIS — Z3490 Encounter for supervision of normal pregnancy, unspecified, unspecified trimester: Secondary | ICD-10-CM

## 2021-07-10 LAB — CBC
HCT: 38.5 % (ref 36.0–46.0)
Hemoglobin: 13 g/dL (ref 12.0–15.0)
MCH: 30.7 pg (ref 26.0–34.0)
MCHC: 33.8 g/dL (ref 30.0–36.0)
MCV: 91 fL (ref 80.0–100.0)
Platelets: 233 10*3/uL (ref 150–400)
RBC: 4.23 MIL/uL (ref 3.87–5.11)
RDW: 11.7 % (ref 11.5–15.5)
WBC: 11.1 10*3/uL — ABNORMAL HIGH (ref 4.0–10.5)
nRBC: 0 % (ref 0.0–0.2)

## 2021-07-10 LAB — URINALYSIS, ROUTINE W REFLEX MICROSCOPIC
Bilirubin Urine: NEGATIVE
Glucose, UA: NEGATIVE mg/dL
Hgb urine dipstick: NEGATIVE
Ketones, ur: 80 mg/dL — AB
Leukocytes,Ua: NEGATIVE
Nitrite: NEGATIVE
Protein, ur: NEGATIVE mg/dL
Specific Gravity, Urine: 1.023 (ref 1.005–1.030)
pH: 5 (ref 5.0–8.0)

## 2021-07-10 LAB — COMPREHENSIVE METABOLIC PANEL
ALT: 18 U/L (ref 0–44)
AST: 19 U/L (ref 15–41)
Albumin: 4.1 g/dL (ref 3.5–5.0)
Alkaline Phosphatase: 47 U/L (ref 38–126)
Anion gap: 6 (ref 5–15)
BUN: 15 mg/dL (ref 6–20)
CO2: 23 mmol/L (ref 22–32)
Calcium: 8.9 mg/dL (ref 8.9–10.3)
Chloride: 106 mmol/L (ref 98–111)
Creatinine, Ser: 0.5 mg/dL (ref 0.44–1.00)
GFR, Estimated: >60 mL/min (ref >60)
Glucose, Bld: 145 mg/dL — ABNORMAL HIGH (ref 70–99)
Potassium: 3.8 mmol/L (ref 3.5–5.1)
Sodium: 135 mmol/L (ref 135–145)
Total Bilirubin: 0.5 mg/dL (ref 0.3–1.2)
Total Protein: 6.8 g/dL (ref 6.5–8.1)

## 2021-07-10 LAB — ABO/RH: ABO/RH(D): A POS

## 2021-07-10 LAB — LIPASE, BLOOD: Lipase: 28 U/L (ref 11–51)

## 2021-07-10 LAB — HCG, QUANTITATIVE, PREGNANCY: hCG, Beta Chain, Quant, S: 12722 m[IU]/mL — ABNORMAL HIGH (ref <5)

## 2021-07-10 LAB — POC URINE PREG, ED: Preg Test, Ur: NEGATIVE

## 2021-07-10 MED ORDER — MORPHINE SULFATE (PF) 4 MG/ML IV SOLN
4.0000 mg | Freq: Once | INTRAVENOUS | Status: AC
  Administered 2021-07-10: 4 mg via INTRAVENOUS
  Filled 2021-07-10: qty 1

## 2021-07-10 MED ORDER — PIPERACILLIN-TAZOBACTAM 3.375 G IVPB 30 MIN
3.3750 g | Freq: Three times a day (TID) | INTRAVENOUS | Status: DC
  Administered 2021-07-10: 3.375 g via INTRAVENOUS
  Filled 2021-07-10: qty 50

## 2021-07-10 MED ORDER — ALBUTEROL SULFATE (2.5 MG/3ML) 0.083% IN NEBU: 2.5000 mg | INHALATION_SOLUTION | Freq: Four times a day (QID) | RESPIRATORY_TRACT | Status: DC | PRN

## 2021-07-10 MED ORDER — KETOROLAC TROMETHAMINE 15 MG/ML IJ SOLN: 15.0000 mg | Freq: Four times a day (QID) | INTRAMUSCULAR | Status: DC | PRN

## 2021-07-10 MED ORDER — ONDANSETRON HCL 4 MG/2ML IJ SOLN
4.0000 mg | Freq: Once | INTRAMUSCULAR | Status: AC
Start: 2021-07-10 — End: 2021-07-10
  Administered 2021-07-10: 4 mg via INTRAVENOUS
  Filled 2021-07-10: qty 2

## 2021-07-10 MED ORDER — ONDANSETRON HCL 4 MG/2ML IJ SOLN: 4.0000 mg | Freq: Four times a day (QID) | INTRAMUSCULAR | Status: DC | PRN

## 2021-07-10 MED ORDER — MORPHINE SULFATE (PF) 2 MG/ML IV SOLN
2.0000 mg | INTRAVENOUS | Status: DC | PRN
  Administered 2021-07-11: 2 mg via INTRAVENOUS
  Filled 2021-07-10: qty 1

## 2021-07-10 MED ORDER — HEPARIN SODIUM (PORCINE) 5000 UNIT/ML IJ SOLN
5000.0000 [IU] | Freq: Three times a day (TID) | INTRAMUSCULAR | Status: DC
Start: 2021-07-11 — End: 2021-07-11
  Administered 2021-07-11: 5000 [IU] via SUBCUTANEOUS
  Filled 2021-07-10 (×3): qty 1

## 2021-07-10 MED ORDER — ACETAMINOPHEN 500 MG PO TABS
1000.0000 mg | ORAL_TABLET | Freq: Four times a day (QID) | ORAL | Status: DC
  Administered 2021-07-10 – 2021-07-11 (×2): 1000 mg via ORAL
  Filled 2021-07-10 (×2): qty 2

## 2021-07-10 MED ORDER — SODIUM CHLORIDE 0.9 % IV SOLN: INTRAVENOUS | Status: DC

## 2021-07-10 MED ORDER — ONDANSETRON 4 MG PO TBDP: 4.0000 mg | ORAL_TABLET | Freq: Four times a day (QID) | ORAL | Status: DC | PRN

## 2021-07-10 MED ORDER — SODIUM CHLORIDE 0.9 % IV BOLUS
1000.0000 mL | Freq: Once | INTRAVENOUS | Status: AC
Start: 2021-07-10 — End: 2021-07-10
  Administered 2021-07-10: 1000 mL via INTRAVENOUS

## 2021-07-10 MED ORDER — ONDANSETRON 4 MG PO TBDP: 4.0000 mg | ORAL_TABLET | Freq: Three times a day (TID) | ORAL | 0 refills | Status: AC | PRN

## 2021-07-10 MED ORDER — PIPERACILLIN-TAZOBACTAM 3.375 G IVPB
3.3750 g | Freq: Three times a day (TID) | INTRAVENOUS | Status: DC
  Administered 2021-07-11: 3.375 g via INTRAVENOUS
  Filled 2021-07-10 (×3): qty 50

## 2021-07-10 MED ORDER — SODIUM CHLORIDE 0.9 % IV BOLUS
1000.0000 mL | Freq: Once | INTRAVENOUS | Status: AC
  Administered 2021-07-10: 1000 mL via INTRAVENOUS

## 2021-07-10 MED ORDER — SODIUM CHLORIDE 0.9 % IV BOLUS
1000.0000 mL | Freq: Once | INTRAVENOUS | Status: DC
Start: 2021-07-10 — End: 2021-07-10

## 2021-07-10 NOTE — ED Notes (Signed)
NS is still infusing. IV site is patent. Patient is calm and cooperative. No acute distress.

## 2021-07-10 NOTE — Plan of Care (Signed)
Admitted to Room 335 with Dx. Of Appendicitis. Pt. Is alert and oriented with pleasant affect. Color good, skin w&D. Abdomen is soft and Non-tender to mild palpation. Positive Bowel Sounds. C/O "Dull" pain mid and right upper abdomen. Oriented to Room, Safety and Security, Fall Prevention and POC. Instructed in Clear Liquid diet until NPO after Midnight and CHG wipes prior to sleep and in A.M. Pt. V/o. Non -Skin socks placed on Pt. As well as ordered SCD's.

## 2021-07-10 NOTE — ED Notes (Signed)
Report given Tamela Oddi, RN

## 2021-07-10 NOTE — Progress Notes (Signed)
Virtual Visit Consent   Melinda Mcdowell, you are scheduled for a virtual visit with a [facility] provider today. Just as with appointments in the office, your consent must be obtained to participate. Your consent will be active for this visit and any virtual visit you may have with one of our providers in the next 365 days. If you have a MyChart account, a copy of this consent can be sent to you electronically.  As this is a virtual visit, video technology does not allow for your provider to perform a traditional examination. This may limit your provider's ability to fully assess your condition. If your provider identifies any concerns that need to be evaluated in person or the need to arrange testing (such as labs, EKG, etc.), we will make arrangements to do so. Although advances in technology are sophisticated, we cannot ensure that it will always work on either your end or our end. If the connection with a video visit is poor, the visit may have to be switched to a telephone visit. With either a video or telephone visit, we are not always able to ensure that we have a secure connection.  By engaging in this virtual visit, you consent to the provision of healthcare and authorize for your insurance to be billed (if applicable) for the services provided during this visit. Depending on your insurance coverage, you may receive a charge related to this service.  I need to obtain your verbal consent now. Are you willing to proceed with your visit today? Rox Purk has provided verbal consent on 07/10/2021 for a virtual visit (video or telephone). Melinda Pounds, NP  Date: 07/10/2021 11:30 AM  Virtual Visit via Video Note   I, Melinda Mcdowell, connected with  Melinda Mcdowell  ([id number], October 29, 1990) on 07/10/21 at 11:00 AM EDT by a video-enabled telemedicine application and verified that I am speaking with the correct person using two identifiers.  Location: Patient: Virtual Visit Location Patient:  Home Provider: Virtual Visit Location Provider: Home Office   I discussed the limitations of evaluation and management by telemedicine and the availability of in person appointments. The patient expressed understanding and agreed to proceed.    History of Present Illness: Melinda Mcdowell is a 31 y.o. who identifies as a female who was assigned female at birth, and is being seen today for viral gastroenteritis.  Ms. Oetken states she went out to eat last night at a restaurant. Later on she began to experience stomach cramping and today has been unable to keep anything down due to vomiting. She is vomiting clear and no hematemesis. Last recorded temp 98.7. Last menstrual period 3 weeks ago. Spotting earlier this week. Denies any chance of being pregnant. Denies loose stools or diarrhea.   Problems: There are no problems to display for this patient.   Allergies: No Known Allergies Medications:  Current Outpatient Medications:    ondansetron (ZOFRAN-ODT) 4 MG disintegrating tablet, Take 1 tablet (4 mg total) by mouth every 8 (eight) hours as needed for nausea or vomiting., Disp: 21 tablet, Rfl: 0   acetaZOLAMIDE (DIAMOX) 125 MG tablet, Take 1 tablet (125 mg total) by mouth 2 (two) times daily., Disp: 10 tablet, Rfl: 0   albuterol (VENTOLIN HFA) 108 (90 Base) MCG/ACT inhaler, Inhale 1-2 puffs into the lungs every 6 (six) hours as needed (cough)., Disp: 6.7 g, Rfl: 0   benzonatate (TESSALON) 100 MG capsule, Take 1 capsule (100 mg total) by mouth 3 (three) times daily as needed for  cough., Disp: 30 capsule, Rfl: 0   Norethindrone Acetate-Ethinyl Estradiol (JUNEL 1.5/30) 1.5-30 MG-MCG tablet, TAKE 1 TABLET BY MOUTH CONTINOUSLY. ACTIVE PILL USE, DO NOT TAKE PLACEBO PILLS., Disp: 84 tablet, Rfl: 0  Observations/Objective: Patient is well-developed, well-nourished in no acute distress.  Resting comfortably  at home.  Head is normocephalic, atraumatic.  No labored breathing.  Speech is clear and  coherent with logical content.  Patient is alert and oriented at baseline.    Assessment and Plan: 1. Viral gastroenteritis - ondansetron (ZOFRAN-ODT) 4 MG disintegrating tablet; Take 1 tablet (4 mg total) by mouth every 8 (eight) hours as needed for nausea or vomiting.  Dispense: 21 tablet; Refill: 0 Report to emergency room if abdominal pain worsens, fever, or continues vomiting despite taking zofran.  Also recommend taking a pregnancy test prior to starting zofran.   Follow Up Instructions: I discussed the assessment and treatment plan with the patient. The patient was provided an opportunity to ask questions and all were answered. The patient agreed with the plan and demonstrated an understanding of the instructions.  A copy of instructions were sent to the patient via MyChart unless otherwise noted below.   The patient was advised to call back or seek an in-person evaluation if the symptoms worsen or if the condition fails to improve as anticipated.  Time:  I spent 12 minutes with the patient via telehealth technology discussing the above problems/concerns.    Melinda Pounds, NP

## 2021-07-10 NOTE — ED Notes (Signed)
POC RESULTED POSITIVE

## 2021-07-10 NOTE — Progress Notes (Signed)
31 YO female w abdominal pain c/w acute appendicitis confirmed by MRI. Incidental positive  pregnancy test . She is not toxic nor peritonitic. LMP suggest she has IU{P 3 weeks but B HCG seems to suggest 6-8 weeks. D/W Dr. Madison Hickman from [facility] No major contraindication for surgical intervention. We will admit, start crystalloids A/bs and perform appy in am which would be safer to do when OB team is available.

## 2021-07-10 NOTE — ED Triage Notes (Signed)
Pt via POV from home. Pt c/o vomiting and epigastric pain. States that it started last night. Denies urinary symptoms. Denies fevers. Denies any abd surgeries. Pt is A&Ox4 and NAD

## 2021-07-10 NOTE — Consult Note (Signed)
Reason for Consult:Positive pregnancy with acute appendicitis  Referring Physician: Jules Husbands, MD   Melinda Mcdowell is an 31 y.o. female.  HPI: 31 YO female w abdominal pain c/w acute appendicitis confirmed by MRI.    Past Medical History:  Diagnosis Date   No pertinent past medical history     Past Surgical History:  Procedure Laterality Date   WISDOM TOOTH EXTRACTION      Family History  Problem Relation Age of Onset   Healthy Mother    Ovarian cysts Mother    Hypertension Father    Asthma Brother    Clotting disorder Maternal Grandmother    Stroke Maternal Grandmother    Diabetes Maternal Grandmother    Breast cancer Neg Hx    Colon cancer Neg Hx    Ovarian cancer Neg Hx    Cervical cancer Neg Hx     Social History:  reports that she has never smoked. She has never used smokeless tobacco. She reports current alcohol use of about 3.0 standard drinks of alcohol per week. She reports that she does not currently use drugs.  Allergies: No Known Allergies  Medications: I have reviewed the patient's current medications.  Results for orders placed or performed during the hospital encounter of 07/10/21 (from the past 48 hour(s))  Lipase, blood     Status: None   Collection Time: 07/10/21 11:51 AM  Result Value Ref Range   Lipase 28 11 - 51 U/L    Comment: Performed at [facility], [address] [place], [place] [address]  Comprehensive metabolic panel     Status: Abnormal   Collection Time: 07/10/21 11:51 AM  Result Value Ref Range   Sodium 135 135 - 145 mmol/L   Potassium 3.8 3.5 - 5.1 mmol/L   Chloride 106 98 - 111 mmol/L   CO2 23 22 - 32 mmol/L   Glucose, Bld 145 (H) 70 - 99 mg/dL    Comment: Glucose reference range applies only to samples taken after fasting for at least 8 hours.   BUN 15 6 - 20 mg/dL   Creatinine, Ser 0.50 0.44 - 1.00 mg/dL   Calcium 8.9 8.9 - 10.3 mg/dL   Total Protein 6.8 6.5 - 8.1 g/dL   Albumin 4.1 3.5 - 5.0 g/dL   AST 19 15  - 41 U/L   ALT 18 0 - 44 U/L   Alkaline Phosphatase 47 38 - 126 U/L   Total Bilirubin 0.5 0.3 - 1.2 mg/dL   GFR, Estimated >60 >60 mL/min    Comment: (NOTE) Calculated using the CKD-EPI Creatinine Equation (2021)    Anion gap 6 5 - 15    Comment: Performed at [facility], [address] [place],  [address]  CBC     Status: Abnormal   Collection Time: 07/10/21 11:51 AM  Result Value Ref Range   WBC 11.1 (H) 4.0 - 10.5 K/uL   RBC 4.23 3.87 - 5.11 MIL/uL   Hemoglobin 13.0 12.0 - 15.0 g/dL   HCT 38.5 36.0 - 46.0 %   MCV 91.0 80.0 - 100.0 fL   MCH 30.7 26.0 - 34.0 pg   MCHC 33.8 30.0 - 36.0 g/dL   RDW 11.7 11.5 - 15.5 %   Platelets 233 150 - 400 K/uL   nRBC 0.0 0.0 - 0.2 %    Comment: Performed at [facility], [address], [place],  [address]  Urinalysis, Routine w reflex microscopic     Status:  Abnormal   Collection Time: 07/10/21 12:00 PM  Result Value Ref Range   Color, Urine YELLOW (A) YELLOW   APPearance CLEAR (A) CLEAR   Specific Gravity, Urine 1.023 1.005 - 1.030   pH 5.0 5.0 - 8.0   Glucose, UA NEGATIVE NEGATIVE mg/dL   Hgb urine dipstick NEGATIVE NEGATIVE   Bilirubin Urine NEGATIVE NEGATIVE   Ketones, ur 80 (A) NEGATIVE mg/dL   Protein, ur NEGATIVE NEGATIVE mg/dL   Nitrite NEGATIVE NEGATIVE   Leukocytes,Ua NEGATIVE NEGATIVE    Comment: Performed at [facility], [address] [place], [place] [address]  ABO/Rh     Status: None   Collection Time: 07/10/21 12:04 PM  Result Value Ref Range   ABO/RH(D)      A POS Performed at [facility], [address] [place], [place] [address]   hCG, quantitative, pregnancy     Status: Abnormal   Collection Time: 07/10/21 12:04 PM  Result Value Ref Range   hCG, Beta Chain, Quant, S 12,722 (H) <5 mIU/mL    Comment:          GEST. AGE      CONC.  (mIU/mL)   <=1 WEEK        5 - 50     2 WEEKS       50 - 500     3 WEEKS       100 - 10,000     4 WEEKS     1,000 -  30,000     5 WEEKS     3,500 - 115,000   6-8 WEEKS     12,000 - 270,000    12 WEEKS     15,000 - 220,000        FEMALE AND NON-PREGNANT FEMALE:     LESS THAN 5 mIU/mL Performed at [facility], [address] [place], [place] [address]   POC urine preg, ED     Status: None   Collection Time: 07/10/21  2:25 PM  Result Value Ref Range   Preg Test, Ur Negative Negative    MR ABDOMEN WO CONTRAST  Result Date: 07/10/2021 CLINICAL DATA:  Right lower quadrant pain, ectopic pregnancy versus appendicitis EXAM: MRI ABDOMEN AND PELVIS WITHOUT CONTRAST TECHNIQUE: Multiplanar multisequence MR imaging of the abdomen and pelvis was performed. No intravenous contrast was administered. COMPARISON:  Right upper quadrant ultrasound, 07/10/2021 FINDINGS: COMBINED FINDINGS FOR BOTH MR ABDOMEN AND PELVIS Lower chest: No acute findings. Hepatobiliary: No mass or other parenchymal abnormality identified. Small adherent gallstone or polyp of the gallbladder fundus measuring no greater than 0.3 cm, as seen by same day ultrasound (series 9, image 32). No biliary ductal dilatation. Pancreas: No mass, inflammatory changes, or other parenchymal abnormality identified.No pancreatic ductal dilatation. Spleen:  Within normal limits in size and appearance. Adrenals/Urinary Tract: Normal adrenal glands. No renal masses or suspicious contrast enhancement identified. No evidence of hydronephrosis. Stomach/Bowel: The appendix is fluid-filled and dilated, measuring up to 1.0 cm, with adjacent fat stranding and fluid (series 9, image 69, series 5, image 22). Visualized portions within the abdomen are otherwise unremarkable. Vascular/Lymphatic: No pathologically enlarged lymph nodes identified. No abdominal aortic aneurysm demonstrated. Reproductive: Small volume free fluid in the low pelvis (series 9, image 8). Small candidate gestational sac in the fundal endometrial cavity with expected physiologic thickening of the gravid  endometrium (series 9, image 80). Right ovarian corpus luteum. Other:  None. Musculoskeletal: No suspicious osseous lesions identified. IMPRESSION: 1. Fluid-filled and  dilated appendix measuring up to 1.0 cm, with adjacent fat stranding and fluid, consistent with acute appendicitis. 2. Small volume free fluid in the low pelvis, most likely reactive without specific findings to suggest perforation. 3. Early candidate intrauterine gestation. 4. Small adherent gallstone or polyp of the gallbladder fundus measuring no greater than 0.3 cm, as seen by same day ultrasound. These results were called by telephone at the time of interpretation on 07/10/2021 at 6:30 pm to Dr. Joni Fears, who verbally acknowledged these results. Electronically Signed   By: Delanna Ahmadi M.D.   On: 07/10/2021 18:33   MR PELVIS WO CONTRAST  Result Date: 07/10/2021 CLINICAL DATA:  Right lower quadrant pain, ectopic pregnancy versus appendicitis EXAM: MRI ABDOMEN AND PELVIS WITHOUT CONTRAST TECHNIQUE: Multiplanar multisequence MR imaging of the abdomen and pelvis was performed. No intravenous contrast was administered. COMPARISON:  Right upper quadrant ultrasound, 07/10/2021 FINDINGS: COMBINED FINDINGS FOR BOTH MR ABDOMEN AND PELVIS Lower chest: No acute findings. Hepatobiliary: No mass or other parenchymal abnormality identified. Small adherent gallstone or polyp of the gallbladder fundus measuring no greater than 0.3 cm, as seen by same day ultrasound (series 9, image 32). No biliary ductal dilatation. Pancreas: No mass, inflammatory changes, or other parenchymal abnormality identified.No pancreatic ductal dilatation. Spleen:  Within normal limits in size and appearance. Adrenals/Urinary Tract: Normal adrenal glands. No renal masses or suspicious contrast enhancement identified. No evidence of hydronephrosis. Stomach/Bowel: The appendix is fluid-filled and dilated, measuring up to 1.0 cm, with adjacent fat stranding and fluid (series 9, image  69, series 5, image 22). Visualized portions within the abdomen are otherwise unremarkable. Vascular/Lymphatic: No pathologically enlarged lymph nodes identified. No abdominal aortic aneurysm demonstrated. Reproductive: Small volume free fluid in the low pelvis (series 9, image 8). Small candidate gestational sac in the fundal endometrial cavity with expected physiologic thickening of the gravid endometrium (series 9, image 80). Right ovarian corpus luteum. Other:  None. Musculoskeletal: No suspicious osseous lesions identified. IMPRESSION: 1. Fluid-filled and dilated appendix measuring up to 1.0 cm, with adjacent fat stranding and fluid, consistent with acute appendicitis. 2. Small volume free fluid in the low pelvis, most likely reactive without specific findings to suggest perforation. 3. Early candidate intrauterine gestation. 4. Small adherent gallstone or polyp of the gallbladder fundus measuring no greater than 0.3 cm, as seen by same day ultrasound. These results were called by telephone at the time of interpretation on 07/10/2021 at 6:30 pm to Dr. Joni Fears, who verbally acknowledged these results. Electronically Signed   By: Delanna Ahmadi M.D.   On: 07/10/2021 18:33   US Abdomen Limited RUQ (LIVER/GB)  Result Date: 07/10/2021 CLINICAL DATA:  Right upper quadrant pain since midnight. EXAM: ULTRASOUND ABDOMEN LIMITED RIGHT UPPER QUADRANT COMPARISON:  None Available. FINDINGS: Gallbladder: Small echogenic lesions along wall consistent with polyps, largest 4 mm. No shadowing stones. No wall thickening or pericholecystic fluid. Common bile duct: Diameter: 3 mm Liver: No focal lesion identified. Within normal limits in parenchymal echogenicity. Portal vein is patent on color Doppler imaging with normal direction of blood flow towards the liver. Other: None. IMPRESSION: 1. No acute findings. No gallstones or evidence of acute cholecystitis. 2. Small gallbladder polyps.  No other abnormality. Electronically  Signed   By: Lajean Manes M.D.   On: 07/10/2021 13:52    Review of Systems Blood pressure 120/80, pulse 86, temperature 97.8 F (36.6 C), temperature source Oral, resp. rate 17, height '5\' 3"'$  (1.6 m), weight 63.5 kg, last menstrual period 06/12/2021, SpO2 99 %.  Physical Exam  Assessment/Plan: Recommend OB u/s for gestational age/viability . No major contraindication for surgical intervention. Reviewed with Dr. Sharlet Salina.   Philip Aspen 07/10/2021, 8:31 PM

## 2021-07-10 NOTE — Patient Instructions (Signed)
  Other Instructions Report to emergency room if abdominal pain worsens, fever, or continues vomiting despite taking zofran.  I Also recommend taking a pregnancy test prior to starting zofran.    Curlene Dolphin, thank you for joining Gildardo Pounds, NP for today's virtual visit.  While this provider is not your primary care provider (PCP), if your PCP is located in our provider database this encounter information will be shared with them immediately following your visit.  Consent: (Patient) Melinda Mcdowell provided verbal consent for this virtual visit at the beginning of the encounter.  Current Medications:  Current Outpatient Medications:    ondansetron (ZOFRAN-ODT) 4 MG disintegrating tablet, Take 1 tablet (4 mg total) by mouth every 8 (eight) hours as needed for nausea or vomiting., Disp: 21 tablet, Rfl: 0   acetaZOLAMIDE (DIAMOX) 125 MG tablet, Take 1 tablet (125 mg total) by mouth 2 (two) times daily., Disp: 10 tablet, Rfl: 0   albuterol (VENTOLIN HFA) 108 (90 Base) MCG/ACT inhaler, Inhale 1-2 puffs into the lungs every 6 (six) hours as needed (cough)., Disp: 6.7 g, Rfl: 0   benzonatate (TESSALON) 100 MG capsule, Take 1 capsule (100 mg total) by mouth 3 (three) times daily as needed for cough., Disp: 30 capsule, Rfl: 0   Norethindrone Acetate-Ethinyl Estradiol (JUNEL 1.5/30) 1.5-30 MG-MCG tablet, TAKE 1 TABLET BY MOUTH CONTINOUSLY. ACTIVE PILL USE, DO NOT TAKE PLACEBO PILLS., Disp: 84 tablet, Rfl: 0   Medications ordered in this encounter:  Meds ordered this encounter  Medications   ondansetron (ZOFRAN-ODT) 4 MG disintegrating tablet    Sig: Take 1 tablet (4 mg total) by mouth every 8 (eight) hours as needed for nausea or vomiting.    Dispense:  21 tablet    Refill:  0    Order Specific Question:   Supervising Provider    Answer:   Sabra Heck, BRIAN [[id number]]     *If you need refills on other medications prior to your next appointment, please contact your pharmacy*  Follow-Up: Call back  or seek an in-person evaluation if the symptoms worsen or if the condition fails to improve as anticipated.   If you have been instructed to have an in-person evaluation today at a local Urgent Care facility, please use the link below. It will take you to a list of all of our available [facility], including address, phone number and hours of operation. Please do not delay care.  [facility]  If you or a family member do not have a primary care provider, use the link below to schedule a visit and establish care. When you choose a [facility] primary care physician or advanced practice provider, you gain a long-term partner in health. Find a Primary Care Provider  Learn more about 's in-office and virtual care options: [facility]

## 2021-07-10 NOTE — ED Provider Notes (Addendum)
[facility] Provider Note    Event Date/Time   First MD Initiated Contact with Patient 07/10/21 1203     (approximate)   History   Abdominal Pain   HPI  Melinda Mcdowell is a 31 y.o. female with no significant past medical history presents with abdominal pain that is sharp in nature.  Patient ate a cheesy dinner last night and began to have abdominal pain.  States the pain has increased and the nausea has decreased.  Did have some vomiting.  States loose stools but no diarrhea.  No history of abdominal surgeries.  No fever or chills.      Physical Exam   Triage Vital Signs: ED Triage Vitals  Enc Vitals Group     BP 07/10/21 1151 113/79     Pulse Rate 07/10/21 1151 64     Resp 07/10/21 1151 18     Temp 07/10/21 1151 97.8 F (36.6 C)     Temp Source 07/10/21 1151 Oral     SpO2 07/10/21 1151 98 %     Weight 07/10/21 1148 140 lb (63.5 kg)     Height 07/10/21 1148 '5\' 3"'$  (1.6 m)     Head Circumference --      Peak Flow --      Pain Score 07/10/21 1148 7     Pain Loc --      Pain Edu? --      Excl. in [place]? --     Most recent vital signs: Vitals:   07/10/21 1151 07/10/21 1744  BP: 113/79 125/84  Pulse: 64 75  Resp: 18 16  Temp: 97.8 F (36.6 C)   SpO2: 98% 98%     General: Awake, no distress.   CV:  Good peripheral perfusion. regular rate and  rhythm Resp:  Normal effort.  Abd:  No distention.  Abdomen is tender in the right upper quadrant and right lower quadrant Other:      ED Results / Procedures / Treatments   Labs (all labs ordered are listed, but only abnormal results are displayed) Labs Reviewed  COMPREHENSIVE METABOLIC PANEL - Abnormal; Notable for the following components:      Result Value   Glucose, Bld 145 (*)    All other components within normal limits  CBC - Abnormal; Notable for the following components:   WBC 11.1 (*)    All other components within normal limits  URINALYSIS, ROUTINE W REFLEX MICROSCOPIC -  Abnormal; Notable for the following components:   Color, Urine YELLOW (*)    APPearance CLEAR (*)    Ketones, ur 80 (*)    All other components within normal limits  HCG, QUANTITATIVE, PREGNANCY - Abnormal; Notable for the following components:   hCG, Beta Chain, Quant, S 12,722 (*)    All other components within normal limits  LIPASE, BLOOD  POC URINE PREG, ED  ABO/RH     EKG  EKG   RADIOLOGY  Ultrasound right upper quadrant   PROCEDURES:   Procedures   MEDICATIONS ORDERED IN ED: Medications  sodium chloride 0.9 % bolus 1,000 mL (0 mLs Intravenous Stopped 07/10/21 1436)  ondansetron (ZOFRAN) injection 4 mg (4 mg Intravenous Given 07/10/21 1244)  morphine (PF) 4 MG/ML injection 4 mg (4 mg Intravenous Given 07/10/21 1244)  sodium chloride 0.9 % bolus 1,000 mL (1,000 mLs Intravenous New Bag/Given 07/10/21 1912)     IMPRESSION / MDM / [place] / ED COURSE  I reviewed the triage vital  signs and the nursing notes.                              Differential diagnosis includes, but is not limited to, acute cholecystitis, acute appendicitis, acute gastroenteritis, bowel obstruction  Patient's presentation is most consistent with acute presentation with potential threat to life or bodily function.   Labs and imaging ordered.  Patient was given normal saline, Zofran, and morphine for pain.  Due to the epigastric pain, triage ordered a EKG which is reassuring.  Normal sinus rhythm.  No STEMI noted.  Ultrasound right upper quadrant was interpreted by me as being negative.  Radiologist is read as no gallstones or sludge.  Labs are concerning as her pregnancy test is positive, her beta hCG is elevated at 12,722, urinalysis has 80 ketones, and CBC has elevated WBC of 11.1.  Remaining labs are normal  Due to these findings and the right lower quadrant tenderness will order MRI of the abdomen/pelvis.  Did discuss this with Dr. Joni Fears who agrees this would be the best  way to assess for appendicitis or ectopic.  MRI of the abdomen/pelvis interpreted by me as being positive for appendicitis.  Dr. Joni Fears spoke with radiologist about the positive result.  Consult to Dr.Pabon for surgery.  If Dr Dahlia Byes is not comfortable performing surgery on pregnant female, patient states she would prefer [facility] ([place]  Consult to gyn per dr pabon, states plan is to admit overnight with surgery in the morning  Patient is aware of the plan, is in stable condition  Patient has been [facility] at [facility] so Dr. Glennon Mac is deferring to Dr. Sharlet Salina.  We will have secretary page Dr. Sharlet Salina.  Spoke with Dr. Sharlet Salina.  She wants ultrasound of the pelvis to assess viability.  Being admitted by surgery.   FINAL CLINICAL IMPRESSION(S) / ED DIAGNOSES   Final diagnoses:  Acute appendicitis, unspecified acute appendicitis type  Intrauterine pregnancy     Rx / DC Orders   ED Discharge Orders     None        Note:  This document was prepared using Dragon voice recognition software and may include unintentional dictation errors.    Versie Starks, PA-C 07/10/21 Warnell Bureau, MD 07/10/21 1936    Versie Starks, PA-C 07/10/21 2033    Carrie Mew, MD 07/16/21 2328

## 2021-07-10 NOTE — ED Notes (Signed)
Attempted to give report. RN will call me back.

## 2021-07-11 ENCOUNTER — Other Ambulatory Visit: Payer: Self-pay

## 2021-07-11 ENCOUNTER — Encounter: Payer: Self-pay | Admitting: Surgery

## 2021-07-11 ENCOUNTER — Encounter
Admission: EM | Disposition: A | Discharge status: Home / Self Care | Payer: Self-pay | Source: Home / Self Care | Attending: Emergency Medicine

## 2021-07-11 ENCOUNTER — Observation Stay: Payer: 59 | Admitting: Anesthesiology

## 2021-07-11 DIAGNOSIS — K3580 Unspecified acute appendicitis: Secondary | ICD-10-CM

## 2021-07-11 HISTORY — PX: LAPAROSCOPIC APPENDECTOMY: SHX408

## 2021-07-11 LAB — CBC
HCT: 32.8 % — ABNORMAL LOW (ref 36.0–46.0)
Hemoglobin: 11.5 g/dL — ABNORMAL LOW (ref 12.0–15.0)
MCH: 31.2 pg (ref 26.0–34.0)
MCHC: 35.1 g/dL (ref 30.0–36.0)
MCV: 88.9 fL (ref 80.0–100.0)
Platelets: 208 10*3/uL (ref 150–400)
RBC: 3.69 MIL/uL — ABNORMAL LOW (ref 3.87–5.11)
RDW: 11.9 % (ref 11.5–15.5)
WBC: 6.1 10*3/uL (ref 4.0–10.5)
nRBC: 0 % (ref 0.0–0.2)

## 2021-07-11 LAB — BASIC METABOLIC PANEL
Anion gap: 4 — ABNORMAL LOW (ref 5–15)
BUN: 9 mg/dL (ref 6–20)
CO2: 25 mmol/L (ref 22–32)
Calcium: 8.5 mg/dL — ABNORMAL LOW (ref 8.9–10.3)
Chloride: 109 mmol/L (ref 98–111)
Creatinine, Ser: 0.41 mg/dL — ABNORMAL LOW (ref 0.44–1.00)
GFR, Estimated: >60 mL/min (ref >60)
Glucose, Bld: 106 mg/dL — ABNORMAL HIGH (ref 70–99)
Potassium: 3.4 mmol/L — ABNORMAL LOW (ref 3.5–5.1)
Sodium: 138 mmol/L (ref 135–145)

## 2021-07-11 LAB — MAGNESIUM: Magnesium: 2.1 mg/dL (ref 1.7–2.4)

## 2021-07-11 LAB — PHOSPHORUS: Phosphorus: 2.9 mg/dL (ref 2.5–4.6)

## 2021-07-11 LAB — HIV ANTIBODY (ROUTINE TESTING W REFLEX): HIV Screen 4th Generation wRfx: NONREACTIVE

## 2021-07-11 SURGERY — APPENDECTOMY, LAPAROSCOPIC
Anesthesia: General

## 2021-07-11 MED ORDER — DEXMEDETOMIDINE HCL IN NACL 200 MCG/50ML IV SOLN
INTRAVENOUS | Status: DC | PRN
  Administered 2021-07-11: 12 ug via INTRAVENOUS

## 2021-07-11 MED ORDER — ONDANSETRON HCL 4 MG/2ML IJ SOLN
4.0000 mg | Freq: Once | INTRAMUSCULAR | Status: AC | PRN
Start: 2021-07-11 — End: 2021-07-11
  Administered 2021-07-11: 4 mg via INTRAVENOUS

## 2021-07-11 MED ORDER — DEXAMETHASONE SODIUM PHOSPHATE 10 MG/ML IJ SOLN
INTRAMUSCULAR | Status: DC | PRN
  Administered 2021-07-11: 10 mg via INTRAVENOUS

## 2021-07-11 MED ORDER — FENTANYL CITRATE (PF) 100 MCG/2ML IJ SOLN
INTRAMUSCULAR | Status: DC | PRN
  Administered 2021-07-11 (×2): 50 ug via INTRAVENOUS

## 2021-07-11 MED ORDER — PROMETHAZINE HCL 25 MG PO TABS
12.5000 mg | ORAL_TABLET | Freq: Four times a day (QID) | ORAL | Status: DC | PRN
  Administered 2021-07-11: 12.5 mg via ORAL
  Filled 2021-07-11: qty 1

## 2021-07-11 MED ORDER — BUPIVACAINE LIPOSOME 1.3 % IJ SUSP
INTRAMUSCULAR | Status: AC
Start: 2021-07-11 — End: ?
  Filled 2021-07-11: qty 20

## 2021-07-11 MED ORDER — SUGAMMADEX SODIUM 200 MG/2ML IV SOLN
INTRAVENOUS | Status: DC | PRN
  Administered 2021-07-11: 50 mg via INTRAVENOUS

## 2021-07-11 MED ORDER — MIDAZOLAM HCL 2 MG/2ML IJ SOLN
INTRAMUSCULAR | Status: DC | PRN
  Administered 2021-07-11: 2 mg via INTRAVENOUS

## 2021-07-11 MED ORDER — DEXAMETHASONE SODIUM PHOSPHATE 10 MG/ML IJ SOLN
INTRAMUSCULAR | Status: AC
  Filled 2021-07-11: qty 1

## 2021-07-11 MED ORDER — FENTANYL CITRATE (PF) 100 MCG/2ML IJ SOLN
INTRAMUSCULAR | Status: AC
  Filled 2021-07-11: qty 2

## 2021-07-11 MED ORDER — OXYCODONE HCL 5 MG PO TABS
5.0000 mg | ORAL_TABLET | ORAL | Status: DC | PRN
  Administered 2021-07-11: 5 mg via ORAL
  Filled 2021-07-11: qty 1

## 2021-07-11 MED ORDER — ONDANSETRON HCL 4 MG/2ML IJ SOLN
INTRAMUSCULAR | Status: DC | PRN
  Administered 2021-07-11: 4 mg via INTRAVENOUS

## 2021-07-11 MED ORDER — PROPOFOL 10 MG/ML IV BOLUS
INTRAVENOUS | Status: DC | PRN
  Administered 2021-07-11: 150 mg via INTRAVENOUS

## 2021-07-11 MED ORDER — OXYCODONE HCL 5 MG PO TABS: 5.0000 mg | ORAL_TABLET | Freq: Four times a day (QID) | ORAL | 0 refills | Status: DC | PRN

## 2021-07-11 MED ORDER — SODIUM CHLORIDE 0.9 % IV SOLN: INTRAVENOUS | Status: DC | PRN

## 2021-07-11 MED ORDER — ONDANSETRON HCL 4 MG/2ML IJ SOLN
INTRAMUSCULAR | Status: AC
Start: 2021-07-11 — End: ?
  Filled 2021-07-11: qty 2

## 2021-07-11 MED ORDER — PROMETHAZINE HCL 12.5 MG PO TABS: 12.5000 mg | ORAL_TABLET | Freq: Four times a day (QID) | ORAL | 0 refills | Status: AC | PRN

## 2021-07-11 MED ORDER — MIDAZOLAM HCL 2 MG/2ML IJ SOLN
INTRAMUSCULAR | Status: AC
  Filled 2021-07-11: qty 2

## 2021-07-11 MED ORDER — LIDOCAINE HCL (CARDIAC) PF 100 MG/5ML IV SOSY
PREFILLED_SYRINGE | INTRAVENOUS | Status: DC | PRN
  Administered 2021-07-11: 60 mg via INTRAVENOUS

## 2021-07-11 MED ORDER — PROPOFOL 10 MG/ML IV BOLUS
INTRAVENOUS | Status: AC
  Filled 2021-07-11: qty 20

## 2021-07-11 MED ORDER — GLYCOPYRROLATE 0.2 MG/ML IJ SOLN
INTRAMUSCULAR | Status: AC
  Filled 2021-07-11: qty 2

## 2021-07-11 MED ORDER — PHENYLEPHRINE HCL (PRESSORS) 10 MG/ML IV SOLN
INTRAVENOUS | Status: DC | PRN
  Administered 2021-07-11 (×2): 80 ug via INTRAVENOUS

## 2021-07-11 MED ORDER — FENTANYL CITRATE (PF) 100 MCG/2ML IJ SOLN
25.0000 ug | INTRAMUSCULAR | Status: DC | PRN
  Administered 2021-07-11 (×4): 25 ug via INTRAVENOUS

## 2021-07-11 MED ORDER — BUPIVACAINE-EPINEPHRINE 0.25% -1:200000 IJ SOLN
INTRAMUSCULAR | Status: DC | PRN
  Administered 2021-07-11: 30 mL

## 2021-07-11 MED ORDER — ROCURONIUM BROMIDE 100 MG/10ML IV SOLN
INTRAVENOUS | Status: DC | PRN
  Administered 2021-07-11: 50 mg via INTRAVENOUS
  Administered 2021-07-11: 10 mg via INTRAVENOUS

## 2021-07-11 MED ORDER — ACETAMINOPHEN 10 MG/ML IV SOLN
1000.0000 mg | Freq: Once | INTRAVENOUS | Status: AC
Start: 2021-07-11 — End: 2021-07-11
  Administered 2021-07-11: 1000 mg via INTRAVENOUS

## 2021-07-11 MED ORDER — ONDANSETRON HCL 4 MG/2ML IJ SOLN
INTRAMUSCULAR | Status: AC
  Filled 2021-07-11: qty 2

## 2021-07-11 MED ORDER — BUPIVACAINE LIPOSOME 1.3 % IJ SUSP
INTRAMUSCULAR | Status: DC | PRN
  Administered 2021-07-11: 20 mL

## 2021-07-11 MED ORDER — GLYCOPYRROLATE 0.2 MG/ML IJ SOLN
INTRAMUSCULAR | Status: DC | PRN
  Administered 2021-07-11: .2 mg via INTRAVENOUS

## 2021-07-11 MED ORDER — 0.9 % SODIUM CHLORIDE (POUR BTL) OPTIME
TOPICAL | Status: DC | PRN
  Administered 2021-07-11: 1000 mL

## 2021-07-11 MED ORDER — NEOSTIGMINE METHYLSULFATE 10 MG/10ML IV SOLN
INTRAVENOUS | Status: AC
Start: 2021-07-11 — End: ?
  Filled 2021-07-11: qty 1

## 2021-07-11 MED ORDER — ACETAMINOPHEN 10 MG/ML IV SOLN
INTRAVENOUS | Status: AC
  Filled 2021-07-11: qty 100

## 2021-07-11 MED ORDER — NEOSTIGMINE METHYLSULFATE 10 MG/10ML IV SOLN
INTRAVENOUS | Status: DC | PRN
  Administered 2021-07-11: 3 mg via INTRAVENOUS
  Administered 2021-07-11: 1 mg via INTRAVENOUS

## 2021-07-11 SURGICAL SUPPLY — 40 items
APPLIER CLIP 5 13 M/L LIGAMAX5 (MISCELLANEOUS)
BAG RETRIEVAL 10 (BASKET) ×1
BLADE CLIPPER SURG (BLADE) ×2 IMPLANT
CLIP APPLIE 5 13 M/L LIGAMAX5 (MISCELLANEOUS) ×1 IMPLANT
CUTTER FLEX LINEAR 45M (STAPLE) ×2 IMPLANT
DERMABOND ADVANCED (GAUZE/BANDAGES/DRESSINGS) ×1
DERMABOND ADVANCED .7 DNX12 (GAUZE/BANDAGES/DRESSINGS) ×1 IMPLANT
ELECT CAUTERY BLADE 6.4 (BLADE) ×2 IMPLANT
ELECT REM PT RETURN 9FT ADLT (ELECTROSURGICAL) ×2
ELECTRODE REM PT RTRN 9FT ADLT (ELECTROSURGICAL) ×1 IMPLANT
GLOVE BIO SURGEON STRL SZ7 (GLOVE) ×2 IMPLANT
GOWN STRL REUS W/ TWL LRG LVL3 (GOWN DISPOSABLE) ×2 IMPLANT
GOWN STRL REUS W/TWL LRG LVL3 (GOWN DISPOSABLE) ×2
IRRIGATION STRYKERFLOW (MISCELLANEOUS) ×1 IMPLANT
IRRIGATOR STRYKERFLOW (MISCELLANEOUS) ×2
IV NS 1000ML (IV SOLUTION) ×1
IV NS 1000ML BAXH (IV SOLUTION) ×1 IMPLANT
MANIFOLD NEPTUNE II (INSTRUMENTS) ×2 IMPLANT
NEEDLE HYPO 22GX1.5 SAFETY (NEEDLE) ×2 IMPLANT
NS IRRIG 500ML POUR BTL (IV SOLUTION) ×2 IMPLANT
PACK LAP CHOLECYSTECTOMY (MISCELLANEOUS) ×2 IMPLANT
PENCIL ELECTRO HAND CTR (MISCELLANEOUS) ×2 IMPLANT
RELOAD 45 VASCULAR/THIN (ENDOMECHANICALS) ×2 IMPLANT
RELOAD STAPLE 45 2.5 WHT GRN (ENDOMECHANICALS) ×1 IMPLANT
RELOAD STAPLE 45 3.5 BLU ETS (ENDOMECHANICALS) ×1 IMPLANT
RELOAD STAPLE TA45 3.5 REG BLU (ENDOMECHANICALS) IMPLANT
SCISSORS METZENBAUM CVD 33 (INSTRUMENTS) IMPLANT
SHEARS HARMONIC ACE PLUS 36CM (ENDOMECHANICALS) ×2 IMPLANT
SLEEVE ENDOPATH XCEL 5M (ENDOMECHANICALS) ×2 IMPLANT
SPONGE T-LAP 18X18 ~~LOC~~+RFID (SPONGE) ×2 IMPLANT
SUT MNCRL AB 4-0 PS2 18 (SUTURE) ×2 IMPLANT
SUT VICRYL 0 AB UR-6 (SUTURE) ×4 IMPLANT
SYR 20ML LL LF (SYRINGE) ×2 IMPLANT
SYS BAG RETRIEVAL 10MM (BASKET) ×1
SYSTEM BAG RETRIEVAL 10MM (BASKET) ×1 IMPLANT
TRAY FOLEY MTR SLVR 16FR STAT (SET/KITS/TRAYS/PACK) ×2 IMPLANT
TROCAR XCEL BLUNT TIP 100MML (ENDOMECHANICALS) ×2 IMPLANT
TROCAR XCEL NON-BLD 5MMX100MML (ENDOMECHANICALS) ×2 IMPLANT
TUBING EVAC SMOKE HEATED PNEUM (TUBING) ×2 IMPLANT
WATER STERILE IRR 500ML POUR (IV SOLUTION) ×2 IMPLANT

## 2021-07-11 NOTE — Progress Notes (Signed)
All discharge instructions reviewed with pt; SL removed; pt discharged via wheelchair escorted by [facility] to medical mall exit; pt going home with husband

## 2021-07-11 NOTE — Discharge Summary (Signed)
[facility] SURGICAL DISCHARGE SUMMARY  Patient ID: Melinda Mcdowell MRN: [id number] DOB/AGE: August 17, 1990 31 y.o.  Admit date: 07/10/2021 Discharge date: 07/11/2021  Discharge Diagnoses Patient Active Problem List   Diagnosis Date Noted   Appendicitis 07/10/2021    Consultants Obstetrics   Procedures 07/11/2021 Laparoscopic Appendectomy  HPI: 31 y.o. female presented to [facility] ED yesterday for abdominal pain. Patient reports the acute onset of generalized discomfort on Saturday, she attributed this to having a big meal. The discomfort persisted into Sunday, but thought this may be constipation. She tried to get relief but the pain only continued to get worse. Ultimately, the pain localized to her right abdomen with accompanied nausea and an episode of emesis. She denied any fever, chills, CP, SOB, urinary changes. No history of similar in the past. No previous intra-abdominal surgeries. Work up in the ED revealed a leukocytosis to 11.1K, renal function normal with sCr - 0.50, no electrolyte derangements. Interesting, she was found to have Beta-Hcg of 12K. 5w 5d intra-uterine pregnancy was confirmed by [place]. She underwent MRI of the abdomen/pelvis which showed acute appendicitis without perforation nor abscess.   Hospital Course: Informed consent was obtained and documented, and patient underwent uneventful laparoscopic appendectomy (Dr Dahlia Byes, 07/11/2021).  Post-operatively, patient did well. The remainder of patient's hospital course was essentially unremarkable, and discharge planning was initiated accordingly with patient safely able to be discharged home with appropriate discharge instruction, pain control, and outpatient follow-up after all of her questions were answered to her expressed satisfaction.   Discharge Condition: Good   Physical Examination:  Constitutional: Well appearing female, NAD Pulmonary: Normal effort, no respiratory distress Gastrointestinal: Soft,  incisional soreness, non-distended, no rebound/guarding Skin: Laparoscopic incisions are CDI with dermabond, no erythema or drainage    Allergies as of 07/11/2021   No Known Allergies      Medication List     TAKE these medications    acetaZOLAMIDE 125 MG tablet Commonly known as: DIAMOX Take 1 tablet (125 mg total) by mouth 2 (two) times daily.   albuterol 108 (90 Base) MCG/ACT inhaler Commonly known as: VENTOLIN HFA Inhale 1-2 puffs into the lungs every 6 (six) hours as needed (cough).   benzonatate 100 MG capsule Commonly known as: TESSALON Take 1 capsule (100 mg total) by mouth 3 (three) times daily as needed for cough.   Norethindrone Acetate-Ethinyl Estradiol 1.5-30 MG-MCG tablet Commonly known as: Junel 1.5/30 TAKE 1 TABLET BY MOUTH CONTINOUSLY. ACTIVE PILL USE, DO NOT TAKE PLACEBO PILLS.   ondansetron 4 MG disintegrating tablet Commonly known as: ZOFRAN-ODT Take 1 tablet (4 mg total) by mouth every 8 (eight) hours as needed for nausea or vomiting.   oxyCODONE 5 MG immediate release tablet Commonly known as: Oxy IR/ROXICODONE Take 1 tablet (5 mg total) by mouth every 6 (six) hours as needed for severe pain.   promethazine 12.5 MG tablet Commonly known as: PHENERGAN Take 1 tablet (12.5 mg total) by mouth every 6 (six) hours as needed for nausea or vomiting (Post op nauseae/emesis).          Follow-up Information     Tylene Fantasia, PA-C. Schedule an appointment as soon as possible for a visit in 2 week(s).   Specialty: Physician Assistant Why: s/p laparoscopic appendectomy Contact information: [address] [place] [address] [phone]                  Time spent on discharge management including discussion of hospital course, clinical condition, outpatient instructions,  prescriptions, and follow up with the patient and members of the medical team: >30 minutes  -- Edison Simon , PA-C [facility]   07/11/2021, 3:45 PM [phone] M-F: 7am - 4pm

## 2021-07-11 NOTE — H&P (Signed)
[facility] SURGICAL HISTORY & PHYSICAL (cpt [phone])  HISTORY OF PRESENT ILLNESS (HPI):  31 y.o. female presented to [facility] ED yesterday for abdominal pain. Patient reports the acute onset of generalized discomfort on Saturday, she attributed this to having a big meal. The discomfort persisted into Sunday, but thought this may be constipation. She tried to get relief but the pain only continued to get worse. Ultimately, the pain localized to her right abdomen with accompanied nausea and an episode of emesis. She denied any fever, chills, CP, SOB, urinary changes. No history of similar in the past. No previous intra-abdominal surgeries. Work up in the ED revealed a leukocytosis to 11.1K, renal function normal with sCr - 0.50, no electrolyte derangements. Interesting, she was found to have Beta-Hcg of 12K. 5w 5d intra-uterine pregnancy was confirmed by [place]. She underwent MRI of the abdomen/pelvis which showed acute appendicitis without perforation nor abscess.   General surgery is consulted by emergency medicine provider Ilean Skill, PA-C for evaluation and management of acute appendicitis in setting of 5w IUP  PAST MEDICAL HISTORY (PMH):  Past Medical History:  Diagnosis Date   No pertinent past medical history     Reviewed. Otherwise negative.   PAST SURGICAL HISTORY ([place]):  Past Surgical History:  Procedure Laterality Date   WISDOM TOOTH EXTRACTION      Reviewed. Otherwise negative.   MEDICATIONS:  Prior to Admission medications   Medication Sig Start Date End Date Taking? Authorizing Provider  acetaZOLAMIDE (DIAMOX) 125 MG tablet Take 1 tablet (125 mg total) by mouth 2 (two) times daily. 3/47/42   Copland, Deirdre Evener, PA-C  albuterol (VENTOLIN HFA) 108 (90 Base) MCG/ACT inhaler Inhale 1-2 puffs into the lungs every 6 (six) hours as needed (cough). 10/30/20   Boddu, Erasmo Downer, FNP  benzonatate (TESSALON) 100 MG capsule Take 1 capsule (100 mg total) by mouth 3 (three) times daily  as needed for cough. 05/23/21   Brunetta Jeans, PA-C  Norethindrone Acetate-Ethinyl Estradiol (JUNEL 1.5/30) 1.5-30 MG-MCG tablet TAKE 1 TABLET BY MOUTH CONTINOUSLY. ACTIVE PILL USE, DO NOT TAKE PLACEBO PILLS. 59/56/38   Copland, Elmo Putt B, PA-C  ondansetron (ZOFRAN-ODT) 4 MG disintegrating tablet Take 1 tablet (4 mg total) by mouth every 8 (eight) hours as needed for nausea or vomiting. 07/10/21   Gildardo Pounds, NP     ALLERGIES:  No Known Allergies   SOCIAL HISTORY:  Social History   Socioeconomic History   Marital status: Married    Spouse name: Tomasita Crumble   Number of children: 0   Years of education: 16   Highest education level: Bachelor's degree (e.g., BA, AB, BS)  Occupational History   Occupation: Scientist, research (medical) rep  Tobacco Use   Smoking status: Never   Smokeless tobacco: Never  Vaping Use   Vaping Use: Never used  Substance and Sexual Activity   Alcohol use: Yes    Alcohol/week: 3.0 standard drinks of alcohol    Types: 3 Glasses of wine per week   Drug use: Not Currently   Sexual activity: Yes    Partners: Male    Birth control/protection: OCP  Other Topics Concern   Not on file  Social History Narrative   Not on file   Social Determinants of Health   Financial Resource Strain: Not on file  Food Insecurity: Not on file  Transportation Needs: Not on file  Physical Activity: Not on file  Stress: Not on file  Social Connections: Not on file  Intimate Partner Violence: Not  on file     FAMILY HISTORY:  Family History  Problem Relation Age of Onset   Healthy Mother    Ovarian cysts Mother    Hypertension Father    Asthma Brother    Clotting disorder Maternal Grandmother    Stroke Maternal Grandmother    Diabetes Maternal Grandmother    Breast cancer Neg Hx    Colon cancer Neg Hx    Ovarian cancer Neg Hx    Cervical cancer Neg Hx     Otherwise negative.   REVIEW OF SYSTEMS:  Review of Systems  Constitutional:  Negative for chills and fever.   HENT:  Negative for congestion and sore throat.   Respiratory:  Negative for cough and shortness of breath.   Cardiovascular:  Negative for chest pain and palpitations.  Gastrointestinal:  Positive for abdominal pain, nausea and vomiting.  Genitourinary:  Negative for dysuria and urgency.  All other systems reviewed and are negative.   VITAL SIGNS:  Temp:  [97.8 F (36.6 C)-99 F (37.2 C)] 98.3 F (36.8 C) (06/12 0730) Pulse Rate:  [64-91] 67 (06/12 0730) Resp:  [16-20] 16 (06/12 0730) BP: (96-127)/(50-84) 119/72 (06/12 0730) SpO2:  [98 %-100 %] 100 % (06/12 0730) Weight:  [63.5 kg] 63.5 kg (06/11 1148)     Height: '5\' 3"'$  (160 cm) Weight: 63.5 kg BMI (Calculated): 24.81   PHYSICAL EXAM:  Physical Exam Vitals and nursing note reviewed. Exam conducted with a chaperone present.  Constitutional:      General: She is not in acute distress.    Appearance: She is well-developed and normal weight. She is not ill-appearing.  HENT:     Head: Normocephalic and atraumatic.  Eyes:     General: No scleral icterus.    Extraocular Movements: Extraocular movements intact.  Cardiovascular:     Rate and Rhythm: Normal rate and regular rhythm.     Heart sounds: Normal heart sounds. No murmur heard. Pulmonary:     Effort: Pulmonary effort is normal. No respiratory distress.     Breath sounds: Normal breath sounds.  Abdominal:     General: Abdomen is flat. There is no distension.     Palpations: Abdomen is soft.     Tenderness: There is abdominal tenderness in the right upper quadrant and right lower quadrant. There is no guarding or rebound. Negative signs include Rovsing's sign and McBurney's sign.  Genitourinary:    Comments: Deferred Skin:    General: Skin is warm and dry.     Coloration: Skin is not jaundiced.     Findings: No erythema.  Neurological:     General: No focal deficit present.     Mental Status: She is alert and oriented to person, place, and time.  Psychiatric:         Mood and Affect: Mood normal.        Behavior: Behavior normal.     INTAKE/OUTPUT:  This shift: No intake/output data recorded.  Last 2 shifts: '@IOLAST2SHIFTS'$ @  Labs:     Latest Ref Rng & Units 07/11/2021    6:59 AM 07/10/2021   11:51 AM 04/19/2021    8:38 AM  CBC  WBC 4.0 - 10.5 K/uL 6.1  11.1  5.1   Hemoglobin 12.0 - 15.0 g/dL 11.5  13.0  13.6   Hematocrit 36.0 - 46.0 % 32.8  38.5  40.4   Platelets 150 - 400 K/uL 208  233  236       Latest Ref Rng & Units  07/11/2021    6:59 AM 07/10/2021   11:51 AM 09/17/2019   10:59 AM  CMP  Glucose 70 - 99 mg/dL 106  145  75   BUN 6 - 20 mg/dL '9  15  13   '$ Creatinine 0.44 - 1.00 mg/dL 0.41  0.50  0.66   Sodium 135 - 145 mmol/L 138  135  139   Potassium 3.5 - 5.1 mmol/L 3.4  3.8  3.9   Chloride 98 - 111 mmol/L 109  106  102   CO2 22 - 32 mmol/L '25  23  24   '$ Calcium 8.9 - 10.3 mg/dL 8.5  8.9  9.0   Total Protein 6.5 - 8.1 g/dL  6.8  6.6   Total Bilirubin 0.3 - 1.2 mg/dL  0.5  0.4   Alkaline Phos 38 - 126 U/L  47  43   AST 15 - 41 U/L  19  18   ALT 0 - 44 U/L  18  13      Imaging studies:   MRI Abdomen/Pelvis (07/10/2021) personally reviewed showing acute appendicitis without abscess or perforation, and radiologist report reviewed below:  IMPRESSION: 1. Fluid-filled and dilated appendix measuring up to 1.0 cm, with adjacent fat stranding and fluid, consistent with acute appendicitis. 2. Small volume free fluid in the low pelvis, most likely reactive without specific findings to suggest perforation. 3. Early candidate intrauterine gestation. 4. Small adherent gallstone or polyp of the gallbladder fundus measuring no greater than 0.3 cm, as seen by same day ultrasound.    Assessment/Plan: (ICD-10's: K35.80) 31 y.o. female with leukocytosis and abdominal pain found to have acute uncomplicated appendicitis, complicated by pertinent comorbidities including incidental find of 5w 5d IUP confirmed by [place].    - Admit to general surgery  service - Dr Dahlia Byes discussed case with OB who notes no major contraindication for appendectomy  - will plan on proceeding with laparoscopic appendectomy this morning with Dr Dahlia Byes pending OR/Anesthesia availability - All risks (including chance of miscarriage), benefits, and alternatives to above procedure(s) were discussed with the patient, all of her questions were answered to her expressed satisfaction, patient expresses she wishes to proceed, and informed consent was obtained.   - NPO + IVF resuscitation  - IV Abx (Zosyn)   - Monitor abdominal examination - Pain control prn (Avoid NSAIDs)  All of the above findings and recommendations were discussed with the patient, and all of her questions were answered to her expressed satisfaction.  -- Edison Simon, PA-C [facility] 07/11/2021, 7:43 AM M-F: 7am - 4pm

## 2021-07-11 NOTE — Anesthesia Preprocedure Evaluation (Signed)
Anesthesia Evaluation  Patient identified by MRN, date of birth, ID band Patient awake    Reviewed: Allergy & Precautions, NPO status , Patient's Chart, lab work & pertinent test results  Airway Mallampati: II  TM Distance: >3 FB Neck ROM: Full    Dental  (+) Teeth Intact, Dental Advisory Given   Pulmonary neg pulmonary ROS,    Pulmonary exam normal breath sounds clear to auscultation       Cardiovascular Exercise Tolerance: Good negative cardio ROS Normal cardiovascular exam Rhythm:Regular Rate:Normal     Neuro/Psych negative neurological ROS  negative psych ROS   GI/Hepatic negative GI ROS, Neg liver ROS,   Endo/Other  negative endocrine ROS  Renal/GU negative Renal ROS  negative genitourinary   Musculoskeletal negative musculoskeletal ROS (+)   Abdominal Normal abdominal exam  (+)   Peds negative pediatric ROS (+)  Hematology negative hematology ROS (+)   Anesthesia Other Findings Past Medical History: No date: No pertinent past medical history  Past Surgical History: No date: WISDOM TOOTH EXTRACTION  BMI    Body Mass Index: 27.34 kg/m      Reproductive/Obstetrics negative OB ROS (+) Pregnancy [redacted] weeks pregnant...                             Anesthesia Physical Anesthesia Plan  ASA: 2  Anesthesia Plan: General   Post-op Pain Management:    Induction: Intravenous  PONV Risk Score and Plan: 1 and Ondansetron and Dexamethasone  Airway Management Planned: Oral ETT  Additional Equipment:   Intra-op Plan:   Post-operative Plan: Extubation in OR  Informed Consent: I have reviewed the patients History and Physical, chart, labs and discussed the procedure including the risks, benefits and alternatives for the proposed anesthesia with the patient or authorized representative who has indicated his/her understanding and acceptance.     Dental Advisory Given  Plan  Discussed with: CRNA and Surgeon  Anesthesia Plan Comments:         Anesthesia Quick Evaluation

## 2021-07-11 NOTE — Op Note (Signed)
laparascopic appendectomy   Melinda Mcdowell Date of operation:  07/11/2021  Indications: The patient presented with a history of  abdominal pain. Workup has revealed findings consistent with acute appendicitis.  Pre-operative Diagnosis: Acute appendicitis  Post-operative Diagnosis: Same  Surgeon: Caroleen Hamman, MD, FACS  Anesthesia: General with endotracheal tube  Findings: Acute non perforated appendicitis  Estimated Blood Loss: 5cc         Specimens: appendix         Complications:  none  Procedure Details  The patient was seen again in the preop area. The options of surgery versus observation were reviewed with the patient and/or family. The risks of bleeding, infection, recurrence of symptoms, negative laparoscopy, potential for an open procedure, bowel injury, abscess or infection, were all reviewed as well. The patient was taken to Operating Room, identified as Melinda Mcdowell and the procedure verified as laparoscopic appendectomy. A Time Out was held and the above information confirmed.  The patient was placed in the supine position and general anesthesia was induced.  Antibiotic prophylaxis was administered and VT E prophylaxis was in place. The abdomen was prepped and draped in a sterile fashion. An infraumbilical incision was made. A cutdown technique was used to enter the abdominal cavity. Two vicryl stitches were placed on the fascia and a Hasson trocar inserted. Pneumoperitoneum obtained. Two 5 mm ports were placed under direct visualization.   The appendix was identified and found to be acutely inflamed  The appendix was carefully dissected. The mesoappendix was divided withHarmonic scalpel. The base of the appendix was dissected out and divided with a standard load Endo GIA.The appendix was placed in a Endo Catch bag and removed via the Hasson port. The right lower quadrant and pelvis was then irrigated with  normal saline which was aspirated. Inspection  failed to identify  any additional bleeding and there were no signs of bowel injury. Again the right lower quadrant was inspected there was no sign of bleeding or bowel injury therefore pneumoperitoneum was released, all ports were removed.  The umbilical fascia was closed with 0 Vicryl interrupted sutures and the skin incisions were approximated with subcuticular 4-0 Monocryl. Dermabond was placed The patient tolerated the procedure well, there were no complications. The sponge lap and needle count were correct at the end of the procedure.  The patient was taken to the recovery room in stable condition to be admitted for continued care.    Caroleen Hamman, MD FACS

## 2021-07-11 NOTE — Progress Notes (Signed)
Lab notified that ordered A.M. labs have not been drawn. Lab states that   Phlebotomist is on another Unit drawing A.M. Labs and is coming to MB Unit next.

## 2021-07-11 NOTE — Discharge Instructions (Addendum)
In addition to included general post-operative instructions,  Diet: Resume home diet.   Activity: No heavy lifting >20 pounds (children, pets, laundry, garbage) for 4 weeks, but light activity and walking are encouraged. Do not drive or drink alcohol if taking narcotic pain medications or having pain that might distract from driving.  Wound care: 2 days after surgery (06/14), you may shower/get incision wet with soapy water and pat dry (do not rub incisions), but no baths or submerging incision underwater until follow-up.   Medications: Resume all home medications. For mild to moderate pain: acetaminophen (Tylenol). MAY HAVE TYLENOL NEXT AT 5PM; may take 2 extra strength every 6 hours as needed for pain; Ibuprofen and other NSAIDs should be avoided. Combining Tylenol with alcohol can substantially increase your risk of causing liver disease. Narcotic pain medications, if prescribed, can be used for severe pain, though may cause nausea, constipation, and drowsiness. Do not combine Tylenol and Percocet (or similar) within a 6 hour period as Percocet (and similar) contain(s) Tylenol. If you do not need the narcotic pain medication, you do not need to fill the prescription.  Call office [phone] / [phone]) at any time if any questions, worsening pain, fevers/chills, bleeding, drainage from incision site, or other concerns.

## 2021-07-11 NOTE — Anesthesia Procedure Notes (Signed)
Procedure Name: Intubation Date/Time: 07/11/2021 8:55 AM  Performed by: Kerri Perches, CRNAPre-anesthesia Checklist: Patient identified, Patient being monitored, Timeout performed, Emergency Drugs available and Suction available Patient Re-evaluated:Patient Re-evaluated prior to induction Oxygen Delivery Method: Circle system utilized Preoxygenation: Pre-oxygenation with 100% oxygen Induction Type: IV induction Ventilation: Mask ventilation without difficulty Laryngoscope Size: Miller and 2 Grade View: Grade I Tube type: Oral Tube size: 6.5 mm Number of attempts: 1 Airway Equipment and Method: Stylet Placement Confirmation: ETT inserted through vocal cords under direct vision, positive ETCO2 and breath sounds checked- equal and bilateral Secured at: 20 cm Tube secured with: Tape Dental Injury: Teeth and Oropharynx as per pre-operative assessment

## 2021-07-11 NOTE — Transfer of Care (Addendum)
Immediate Anesthesia Transfer of Care Note  Patient: Melinda Mcdowell  Procedure(s) Performed: APPENDECTOMY LAPAROSCOPIC  Patient Location: PACU  Anesthesia Type:General  Level of Consciousness: awake  Airway & Oxygen Therapy: Patient Spontanous Breathing  Post-op Assessment: Report given to RN  Post vital signs: stable  Last Vitals:  Vitals Value Taken Time  BP    Temp    Pulse    Resp    SpO2      Last Pain:  Vitals:   07/11/21 0812  TempSrc: Oral  PainSc: 3          Complications: No notable events documented.

## 2021-07-11 NOTE — Anesthesia Postprocedure Evaluation (Signed)
Anesthesia Post Note  Patient: Melinda Mcdowell  Procedure(s) Performed: APPENDECTOMY LAPAROSCOPIC  Patient location during evaluation: PACU Anesthesia Type: General Level of consciousness: awake and oriented Pain management: pain level controlled Vital Signs Assessment: post-procedure vital signs reviewed and stable Respiratory status: spontaneous breathing and respiratory function stable Cardiovascular status: stable Anesthetic complications: no   No notable events documented.   Last Vitals:  Vitals:   07/11/21 1100 07/11/21 1129  BP: 110/74 108/73  Pulse: 62 (!) 58  Resp:    Temp:  36.9 C  SpO2:  99%    Last Pain:  Vitals:   07/11/21 1100  TempSrc:   PainSc: Asleep                 VAN STAVEREN,Desman Polak

## 2021-07-12 ENCOUNTER — Encounter: Payer: Self-pay | Admitting: Surgery

## 2021-07-13 ENCOUNTER — Telehealth: Payer: Self-pay

## 2021-07-13 ENCOUNTER — Other Ambulatory Visit: Payer: Self-pay | Admitting: Pathology

## 2021-07-13 LAB — SURGICAL PATHOLOGY

## 2021-07-13 NOTE — Telephone Encounter (Signed)
Transition Care Management Unsuccessful Follow-up Telephone Call  Date of discharge and from where:  [facility] 07/10/21-07/11/21  Attempts:  1st Attempt  Reason for unsuccessful TCM follow-up call:  Unable to leave message  Johnney Killian, RN, BSN, [facility] Phone: [phone]: [phone]

## 2021-07-14 ENCOUNTER — Telehealth: Payer: Self-pay

## 2021-07-14 NOTE — Telephone Encounter (Signed)
Transition Care Management Follow-up Telephone Call Date of discharge and from where: [facility] 07/10/21-07/11/21 How have you been since you were released from the hospital? "I am feeling good as can be expected.  I am still sore but am only taking Tylenol at this point" Any questions or concerns? No  Items Reviewed: Did the pt receive and understand the discharge instructions provided? Yes  Medications obtained and verified? Yes  Other?  N/A Any new allergies since your discharge? No  Dietary orders reviewed? Yes Do you have support at home? Yes   Home Care and Equipment/Supplies: Were home health services ordered? not applicable If so, what is the name of the agency? N/A  Has the agency set up a time to come to the patient's home? not applicable Were any new equipment or medical supplies ordered?  No What is the name of the medical supply agency? N/A Were you able to get the supplies/equipment? not applicable Do you have any questions related to the use of the equipment or supplies? No  Functional Questionnaire: (I = Independent and D = Dependent) ADLs: I  Bathing/Dressing- I  Meal Prep- I  Eating- I  Maintaining continence- I  Transferring/Ambulation- I  Managing Meds- I  Follow up appointments reviewed:  PCP Hospital f/u appt confirmed? No   Specialist Hospital f/u appt confirmed? Yes  Scheduled to see Loel Dubonnet, MD on 07/26/21 @ 3:30. Are transportation arrangements needed? No  If their condition worsens, is the pt aware to call PCP or go to the Emergency Dept.? Yes Was the patient provided with contact information for the PCP's office or ED? Yes Was to pt encouraged to call back with questions or concerns? Yes Johnney Killian, RN, BSN, CCM Care Management Coordinator Phone: [phone]: [phone]

## 2021-07-18 ENCOUNTER — Encounter: Payer: Self-pay | Admitting: Obstetrics and Gynecology

## 2021-07-21 ENCOUNTER — Other Ambulatory Visit: Payer: 59

## 2021-07-21 NOTE — Progress Notes (Signed)
This patient was not discussed today, but Dr Dahlia Byes was messaged to let him know that patient will need to be seen by Medical oncology

## 2021-07-22 ENCOUNTER — Other Ambulatory Visit: Payer: Self-pay

## 2021-07-22 ENCOUNTER — Telehealth: Payer: Self-pay | Admitting: Surgery

## 2021-07-22 DIAGNOSIS — C7A8 Other malignant neuroendocrine tumors: Secondary | ICD-10-CM

## 2021-07-26 ENCOUNTER — Ambulatory Visit (INDEPENDENT_AMBULATORY_CARE_PROVIDER_SITE_OTHER): Payer: 59 | Admitting: Physician Assistant

## 2021-07-26 ENCOUNTER — Telehealth: Payer: Self-pay

## 2021-07-26 ENCOUNTER — Encounter: Payer: Self-pay | Admitting: Physician Assistant

## 2021-07-26 ENCOUNTER — Other Ambulatory Visit: Payer: Self-pay

## 2021-07-26 VITALS — BP 104/71 | HR 87 | Temp 98.2°F | Ht 63.0 in | Wt 139.0 lb

## 2021-07-26 DIAGNOSIS — K3580 Unspecified acute appendicitis: Secondary | ICD-10-CM

## 2021-07-26 DIAGNOSIS — C7A8 Other malignant neuroendocrine tumors: Secondary | ICD-10-CM

## 2021-07-26 DIAGNOSIS — Z09 Encounter for follow-up examination after completed treatment for conditions other than malignant neoplasm: Secondary | ICD-10-CM

## 2021-07-26 DIAGNOSIS — K353 Acute appendicitis with localized peritonitis, without perforation or gangrene: Secondary | ICD-10-CM

## 2021-07-26 NOTE — Telephone Encounter (Signed)
Patient called and would like to instead be referred to Dr Reginia Naas at [facility].  All records and referral has been faxed to their office. PH: [phone] Fax: [phone]

## 2021-08-03 ENCOUNTER — Ambulatory Visit (INDEPENDENT_AMBULATORY_CARE_PROVIDER_SITE_OTHER): Payer: 59

## 2021-08-03 VITALS — Wt 139.0 lb

## 2021-08-03 DIAGNOSIS — Z3401 Encounter for supervision of normal first pregnancy, first trimester: Secondary | ICD-10-CM

## 2021-08-03 DIAGNOSIS — Z3A00 Weeks of gestation of pregnancy not specified: Secondary | ICD-10-CM

## 2021-08-03 DIAGNOSIS — Z3480 Encounter for supervision of other normal pregnancy, unspecified trimester: Secondary | ICD-10-CM

## 2021-08-03 NOTE — Progress Notes (Signed)
New OB Intake  I connected with  Melinda Mcdowell on 08/03/21 at  9:15 AM EDT by telephone Video Visit and verified that I am speaking with the correct person using two identifiers. Nurse is located at [employer] and pt is located at home.  I explained I am completing New OB Intake today. We discussed her EDD of 03/08/2022 that is based on LMP of 06/01/2021. Pt is G1/P0. I reviewed her allergies, medications, Medical/Surgical/OB history, and appropriate screenings. Based on history, this is a/an pregnancy uncomplicated .   Patient Active Problem List   Diagnosis Date Noted   Supervision of other normal pregnancy, antepartum 08/03/2021   Appendicitis 07/10/2021    Concerns addressed today Pt had questions about what she can and cannot do or eat; had questions about having the surgery early in preg and the medications she was on; multiple questions answered.  Delivery Plans:  Plans to deliver at [facility]  Anatomy US Explained first scheduled [place] will be around 20 weeks.   Labs Discussed genetic screening with patient. Patient desires genetic testing to be drawn with new OB labs. Discussed possible labs to be drawn at lab appointment.  COVID Vaccine Patient has not had COVID vaccine.   Social Determinants of Health Food Insecurity: denies food insecurity Transportation: Patient denies transportation needs.  First visit review I reviewed new OB appt with pt. I explained she will have ob bloodwork and pap smear/pelvic exam if indicated. Explained pt will be seen by Leslie Andrea, CNM at first visit; encounter routed to appropriate provider.   Cleophas Dunker, [facility] 08/03/2021  9:44 AM  Clinical Staff Provider  Office Location  [facility] Dating    Language  English Anatomy US    Flu Vaccine  offer Genetic Screen  NIPS:   TDaP vaccine   needs Hgb A1C or  GTT Early : Third trimester :   Covid declined   LAB RESULTS   Rhogam   Blood Type --/--/A POS Performed at  [facility], [address] [place], [place] [address]  [phone]/11 [phone])   Feeding Plan Breast/formula Antibody    Contraception pill Rubella    Circumcision yes RPR     Pediatrician  Undecided  HBsAg     Support Person Tomasita Crumble HIV Non Reactive (06/12 6063)  Prenatal Classes yes Varicella     GBS  (For PCN allergy, check sensitivities)   BTL Consent  Hep C   VBAC Consent  Pap      Hgb Electro      CF      SMA

## 2021-08-12 ENCOUNTER — Other Ambulatory Visit: Payer: 59

## 2021-08-12 DIAGNOSIS — Z3480 Encounter for supervision of other normal pregnancy, unspecified trimester: Secondary | ICD-10-CM

## 2021-08-13 LAB — CBC/D/PLT+RPR+RH+ABO+RUBIGG...
Antibody Screen: NEGATIVE
Basophils Absolute: 0 10*3/uL (ref 0.0–0.2)
Basos: 0 %
EOS (ABSOLUTE): 0.1 10*3/uL (ref 0.0–0.4)
Eos: 1 %
HCV Ab: NONREACTIVE
HIV Screen 4th Generation wRfx: NONREACTIVE
Hematocrit: 37.1 % (ref 34.0–46.6)
Hemoglobin: 13 g/dL (ref 11.1–15.9)
Hepatitis B Surface Ag: NEGATIVE
Immature Grans (Abs): 0 10*3/uL (ref 0.0–0.1)
Immature Granulocytes: 0 %
Lymphocytes Absolute: 2.2 10*3/uL (ref 0.7–3.1)
Lymphs: 31 %
MCH: 32.1 pg (ref 26.6–33.0)
MCHC: 35 g/dL (ref 31.5–35.7)
MCV: 92 fL (ref 79–97)
Monocytes Absolute: 0.4 10*3/uL (ref 0.1–0.9)
Monocytes: 6 %
Neutrophils Absolute: 4.3 10*3/uL (ref 1.4–7.0)
Neutrophils: 62 %
Platelets: 206 10*3/uL (ref 150–450)
RBC: 4.05 x10E6/uL (ref 3.77–5.28)
RDW: 11.7 % (ref 11.7–15.4)
RPR Ser Ql: NONREACTIVE
Rh Factor: POSITIVE
Rubella Antibodies, IGG: 2.63 index (ref >0.99)
Varicella zoster IgG: 1247 index (ref >165)
WBC: 7 10*3/uL (ref 3.4–10.8)

## 2021-08-13 LAB — HCV INTERPRETATION

## 2021-08-18 LAB — MATERNIT 21 PLUS CORE, BLOOD
Fetal Fraction: 9
Result (T21): NEGATIVE
Trisomy 13 (Patau syndrome): NEGATIVE
Trisomy 18 (Edwards syndrome): NEGATIVE
Trisomy 21 (Down syndrome): NEGATIVE

## 2021-08-19 ENCOUNTER — Encounter: Payer: 59 | Admitting: Advanced Practice Midwife

## 2021-08-21 ENCOUNTER — Encounter: Payer: Self-pay | Admitting: Licensed Practical Nurse

## 2021-08-22 ENCOUNTER — Encounter: Payer: 59 | Admitting: Licensed Practical Nurse

## 2021-08-29 ENCOUNTER — Encounter: Payer: 59 | Admitting: Advanced Practice Midwife

## 2022-04-07 ENCOUNTER — Ambulatory Visit
Admission: RE | Admit: 2022-04-07 | Discharge: 2022-04-07 | Disposition: A | Discharge status: Home / Self Care | Payer: 59 | Source: Ambulatory Visit

## 2022-04-07 ENCOUNTER — Ambulatory Visit: Payer: 59

## 2022-04-07 VITALS — BP 107/73 | HR 77 | Temp 98.6°F | Resp 18

## 2022-04-07 DIAGNOSIS — H60503 Unspecified acute noninfective otitis externa, bilateral: Secondary | ICD-10-CM | POA: Diagnosis not present

## 2022-04-07 MED ORDER — OFLOXACIN 0.3 % OT SOLN: 10.0000 [drp] | Freq: Every day | OTIC | 0 refills | Status: AC

## 2022-04-07 NOTE — Discharge Instructions (Addendum)
Use the ear drops as directed.  Follow up with your primary care provider if your symptoms are not improving.    

## 2022-04-07 NOTE — ED Provider Notes (Signed)
Roderic Palau    CSN: [id number] Arrival date & time: 04/07/22  1736      History   Chief Complaint Chief Complaint  Patient presents with   Ear Fullness    Ear pain - Entered by patient    HPI Melinda Mcdowell is a 32 y.o. female.  Patient presents with 5-day history of bilateral ear pain.  She denies fever, chills, rash, ear drainage, sore throat, cough, shortness of breath, or other symptoms.  Treating with ibuprofen.     The history is provided by the patient and medical records.    Past Medical History:  Diagnosis Date   No pertinent past medical history     Patient Active Problem List   Diagnosis Date Noted   Supervision of other normal pregnancy, antepartum 08/03/2021   Appendicitis 07/10/2021    Past Surgical History:  Procedure Laterality Date   HEMICOLECTOMY Right    LAPAROSCOPIC APPENDECTOMY N/A 07/11/2021   Procedure: APPENDECTOMY LAPAROSCOPIC;  Surgeon: Jules Husbands, MD;  Location: [facility] ORS;  Service: General;  Laterality: N/A;   WISDOM TOOTH EXTRACTION      OB History     Gravida  1   Para  0   Term  0   Preterm  0   AB  0   Living  0      SAB  0   IAB  0   Ectopic  0   Multiple  0   Live Births  0            Home Medications    Prior to Admission medications   Medication Sig Start Date End Date Taking? Authorizing Provider  norethindrone (MICRONOR) 0.35 MG tablet Take by mouth. 04/04/22 04/04/23 Yes [provider]  ofloxacin (FLOXIN) 0.3 % OTIC solution Place 10 drops into both ears daily. 04/07/22  Yes Sharion Balloon, NP  acetaminophen (TYLENOL) 325 MG tablet Take 650 mg by mouth every 6 (six) hours as needed.    [provider]  albuterol (VENTOLIN HFA) 108 (90 Base) MCG/ACT inhaler Inhale 1-2 puffs into the lungs every 6 (six) hours as needed (cough). 10/30/20   Boddu, Erasmo Downer, FNP  ondansetron (ZOFRAN-ODT) 4 MG disintegrating tablet Take 1 tablet (4 mg total) by mouth every 8 (eight) hours as needed  for nausea or vomiting. Patient not taking: Reported on 04/07/2022 07/10/21   Gildardo Pounds, NP  promethazine (PHENERGAN) 12.5 MG tablet Take 1 tablet (12.5 mg total) by mouth every 6 (six) hours as needed for nausea or vomiting (Post op nauseae/emesis). Patient not taking: Reported on 04/07/2022 07/11/21   Tylene Fantasia, PA-C    Family History Family History  Problem Relation Age of Onset   Healthy Mother    Ovarian cysts Mother    Hypertension Father    Asthma Brother    Hirschsprung's disease Brother    Clotting disorder Maternal Grandmother    Stroke Maternal Grandmother    Diabetes Maternal Grandmother    Dementia Maternal Grandmother    Obesity Maternal Grandfather        had gastric bypass   Hypertension Paternal Grandmother    Hirschsprung's disease Nephew        brother's second son   Breast cancer Neg Hx    Colon cancer Neg Hx    Ovarian cancer Neg Hx    Cervical cancer Neg Hx     Social History Social History   Tobacco Use   Smoking status: Never  Smokeless tobacco: Never  Vaping Use   Vaping Use: Never used  Substance Use Topics   Alcohol use: Not Currently    Alcohol/week: 3.0 standard drinks of alcohol    Types: 3 Glasses of wine per week   Drug use: Not Currently     Allergies   Patient has no known allergies.   Review of Systems Review of Systems  Constitutional:  Negative for chills and fever.  HENT:  Positive for ear pain. Negative for ear discharge and sore throat.   Respiratory:  Negative for cough and shortness of breath.   Gastrointestinal:  Negative for diarrhea and vomiting.  Skin:  Negative for color change and rash.  All other systems reviewed and are negative.    Physical Exam Triage Vital Signs ED Triage Vitals [04/07/22 1744]  Enc Vitals Group     BP      Pulse Rate 77     Resp 18     Temp 98.6 F (37 C)     Temp src      SpO2 96 %     Weight      Height      Head Circumference      Peak Flow      Pain Score       Pain Loc      Pain Edu?      Excl. in [place]?    No data found.  Updated Vital Signs BP 107/73   Pulse 77   Temp 98.6 F (37 C)   Resp 18   LMP 06/01/2021 (Exact Date) Comment: 03/07/22  SpO2 96%   Breastfeeding Unknown   Visual Acuity Right Eye Distance:   Left Eye Distance:   Bilateral Distance:    Right Eye Near:   Left Eye Near:    Bilateral Near:     Physical Exam Vitals and nursing note reviewed.  Constitutional:      General: She is not in acute distress.    Appearance: Normal appearance. She is well-developed. She is not ill-appearing.  HENT:     Right Ear: Tympanic membrane normal.     Left Ear: Tympanic membrane normal.     Ears:     Comments: Both ear canals erythematous and tender. No drainage.     Nose: Nose normal.     Mouth/Throat:     Mouth: Mucous membranes are moist.     Pharynx: Oropharynx is clear.  Cardiovascular:     Rate and Rhythm: Normal rate and regular rhythm.     Heart sounds: Normal heart sounds.  Pulmonary:     Effort: Pulmonary effort is normal. No respiratory distress.     Breath sounds: Normal breath sounds.  Musculoskeletal:     Cervical back: Neck supple.  Skin:    General: Skin is warm and dry.  Neurological:     Mental Status: She is alert.  Psychiatric:        Mood and Affect: Mood normal.        Behavior: Behavior normal.      UC Treatments / Results  Labs (all labs ordered are listed, but only abnormal results are displayed) Labs Reviewed - No data to display  EKG   Radiology No results found.  Procedures Procedures (including critical care time)  Medications Ordered in UC Medications - No data to display  Initial Impression / Assessment and Plan / UC Course  I have reviewed the triage vital signs and the nursing notes.  Pertinent  labs & imaging results that were available during my care of the patient were reviewed by me and considered in my medical decision making (see chart for details).   Acute  otitis externa of both ears.  Treating with ofloxacin eardrops.  Tylenol or ibuprofen as needed for discomfort.  Instructed patient to follow-up with her PCP if her symptoms are not improving.  Education provided on otitis externa.  She agrees to plan of care.   Final Clinical Impressions(s) / UC Diagnoses   Final diagnoses:  Acute otitis externa of both ears, unspecified type     Discharge Instructions      Use the ear drops as directed.  Follow up with your primary care provider if your symptoms are not improving.        ED Prescriptions     Medication Sig Dispense Auth. Provider   ofloxacin (FLOXIN) 0.3 % OTIC solution Place 10 drops into both ears daily. 5 mL Sharion Balloon, NP      PDMP not reviewed this encounter.   Sharion Balloon, NP 04/07/22 [phone]

## 2022-04-07 NOTE — ED Triage Notes (Signed)
Patient to Urgent Care with complaints of  bilateral ear pain that started five days ago. States pain is worse on the right side. States the pain is in the ear canal.  Denies any known fevers.

## 2022-09-18 ENCOUNTER — Encounter (INDEPENDENT_AMBULATORY_CARE_PROVIDER_SITE_OTHER): Payer: 59 | Admitting: Family Medicine

## 2022-09-18 DIAGNOSIS — Z2989 Encounter for other specified prophylactic measures: Secondary | ICD-10-CM | POA: Diagnosis not present

## 2022-09-19 MED ORDER — ACETAZOLAMIDE 125 MG PO TABS: 125.0000 mg | ORAL_TABLET | Freq: Two times a day (BID) | ORAL | 0 refills | Status: AC

## 2022-09-19 NOTE — Telephone Encounter (Signed)
Please see the MyChart message reply(ies) for my assessment and plan.    This patient gave consent for this Medical Advice Message and is aware that it may result in a bill to their insurance company, as well as the possibility of receiving a bill for a co-payment or deductible. They are an established patient, but are not seeking medical advice exclusively about a problem treated during an in person or video visit in the last seven days. I did not recommend an in person or video visit within seven days of my reply.    I spent a total of 7 minutes cumulative time within 7 days through MyChart messaging.  Angela Bacigalupo, MD   

## 2023-03-31 IMAGING — US US OB < 14 WEEKS - US OB TV
1 series · 14 of 28 positions shown · non-contrast
Comparison: None Available.

CLINICAL DATA: Right lower quadrant pain

EXAM:
OBSTETRIC <14 WK US AND TRANSVAGINAL OB US
TECHNIQUE: Both transabdominal and transvaginal ultrasound examinations were
performed for complete evaluation of the gestation as well as the
maternal uterus, adnexal regions, and pelvic cul-de-sac.
Transvaginal technique was performed to assess early pregnancy.

[Series 1: us ob less than 14 weeks with ob transvaginal · 14 of 133 slices shown]
[im 5/133]
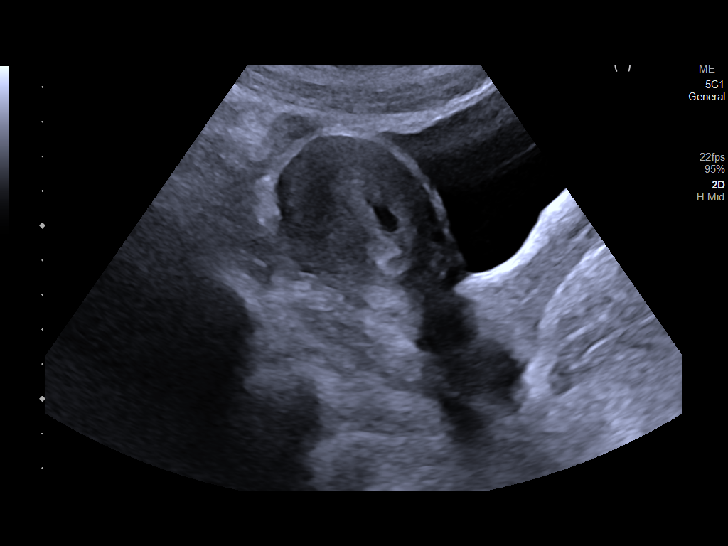
[im 15/133]
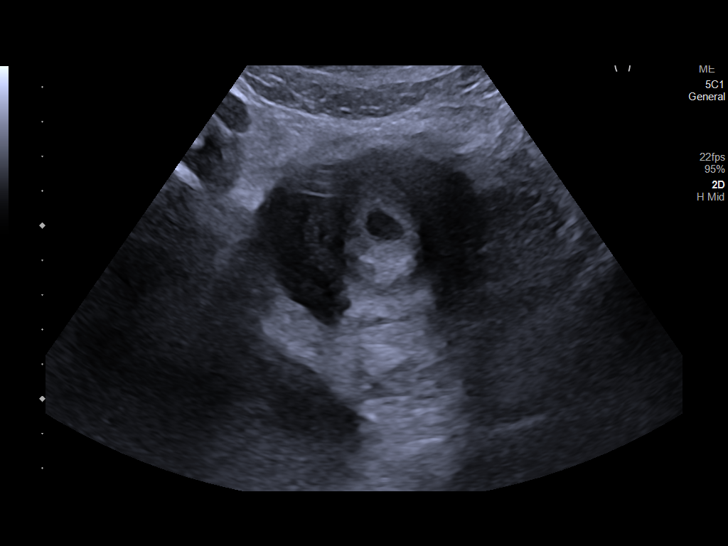
[im 25/133]
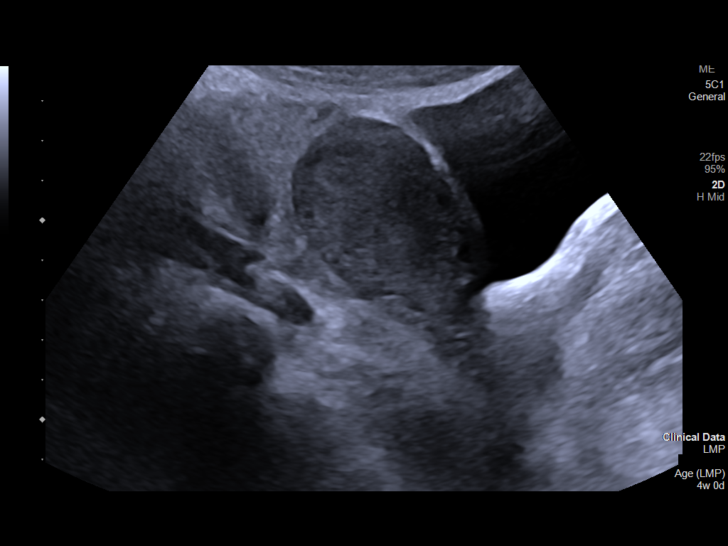
[im 35/133]
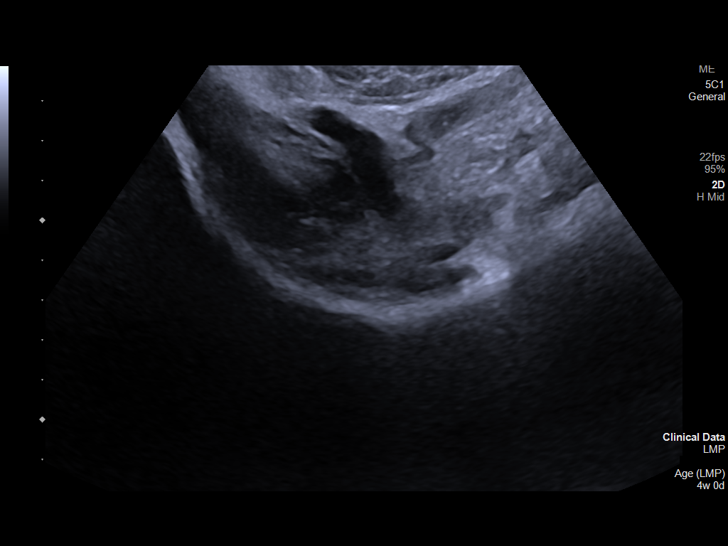
[im 45/133]
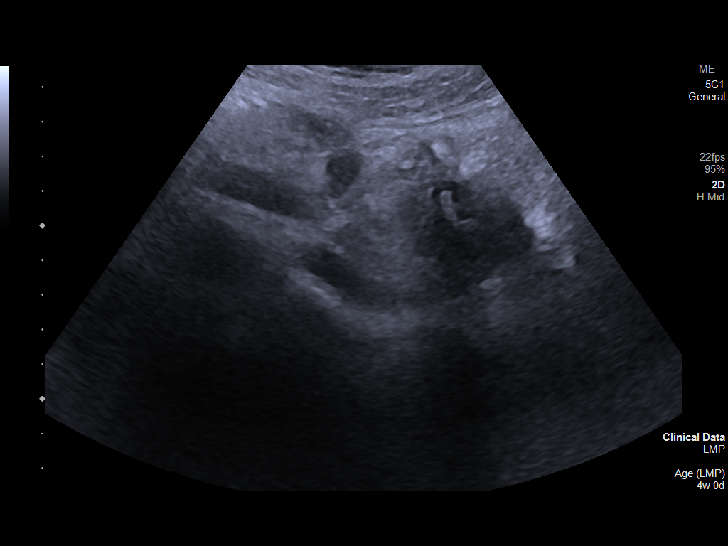
[im 54/133]
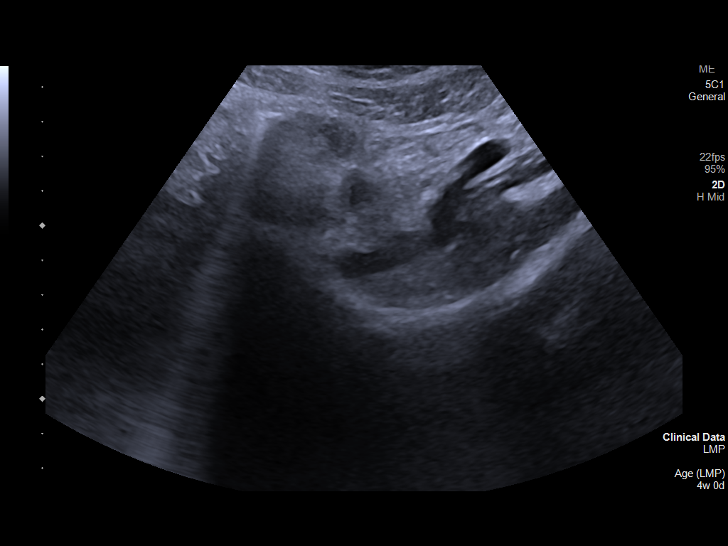
[im 64/133]
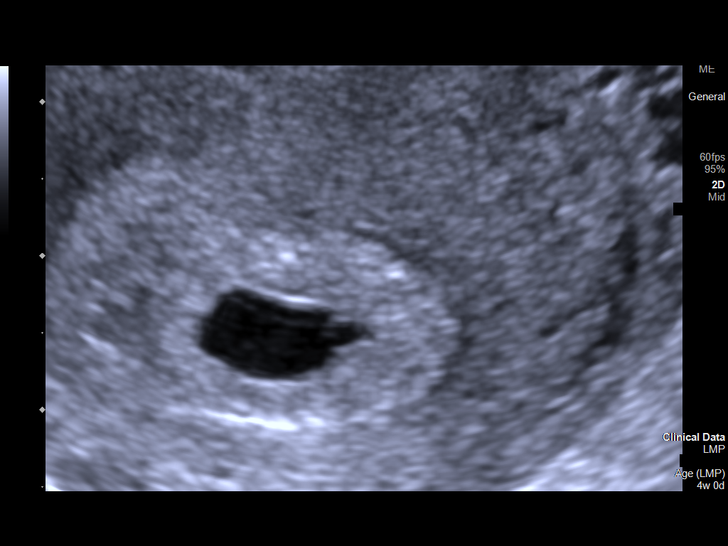
[im 74/133]
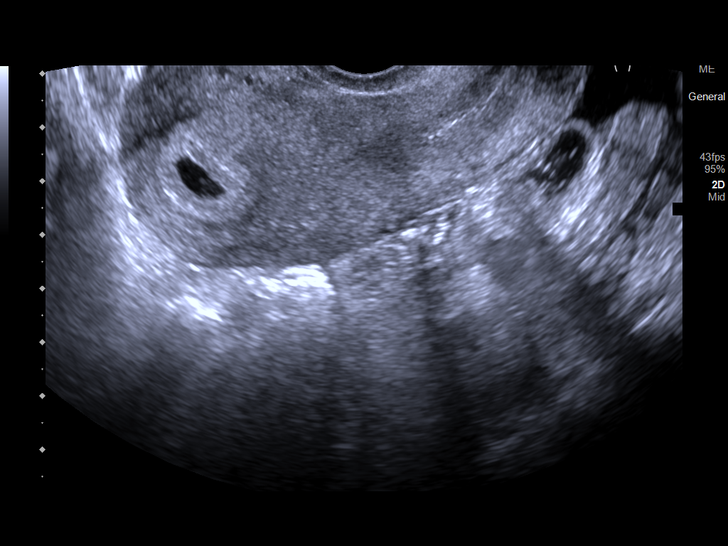
[im 84/133]
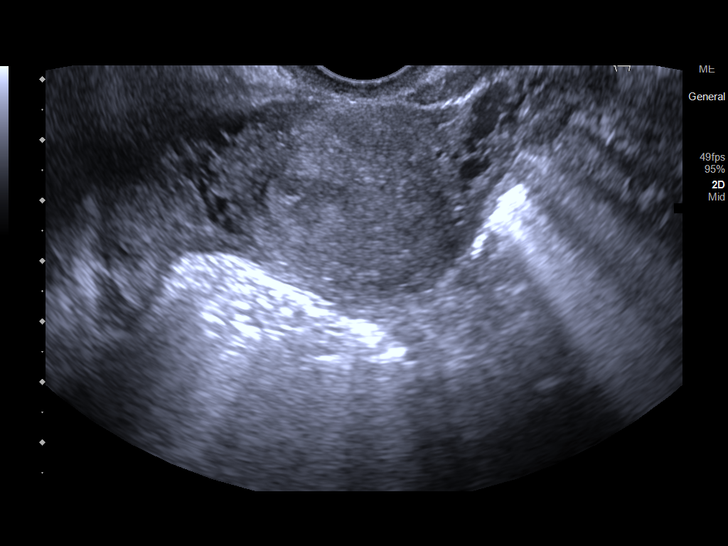
[im 93/133]
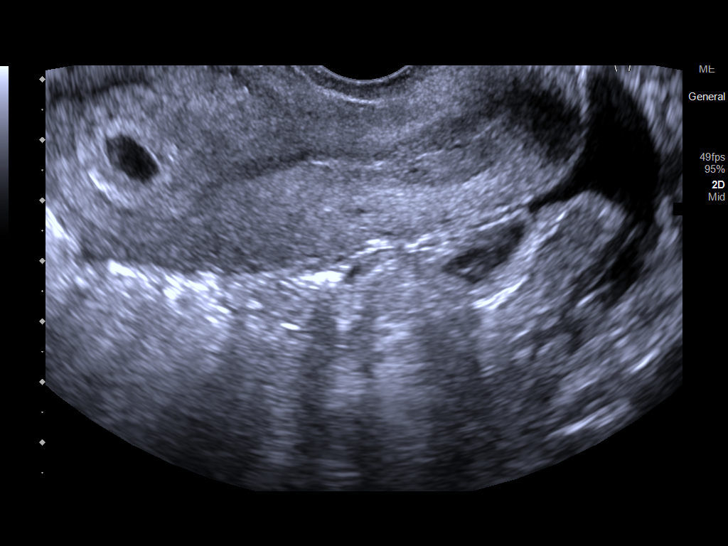
[im 103/133]
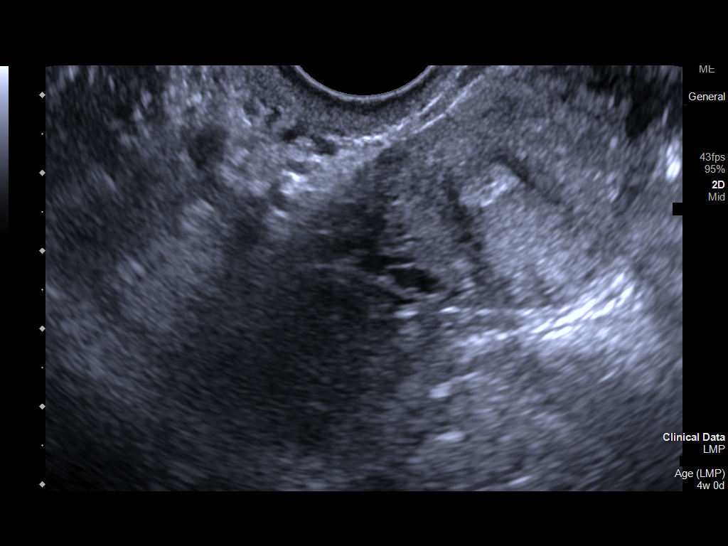
[im 113/133]
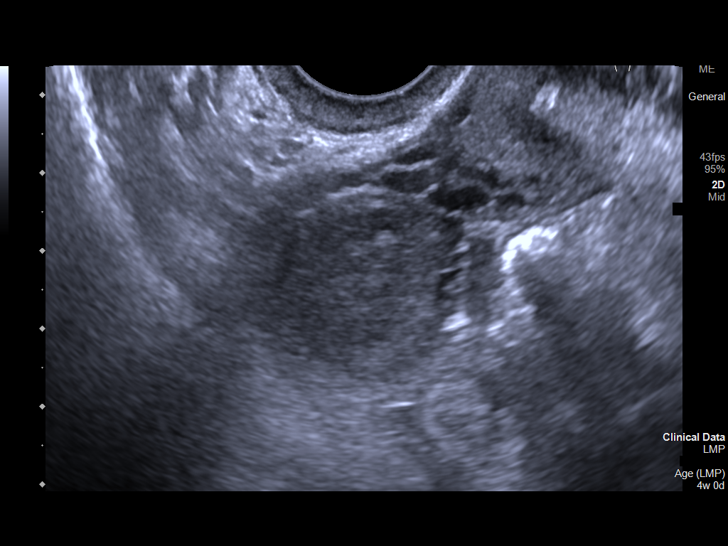
[im 123/133]
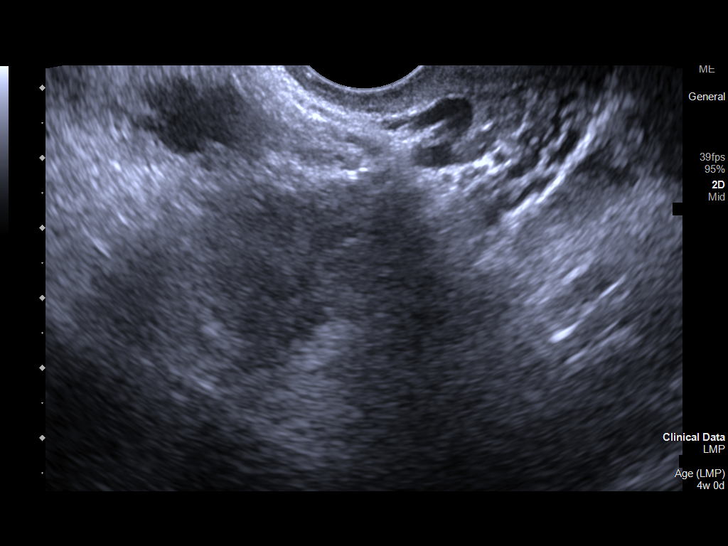
[im 133/133]
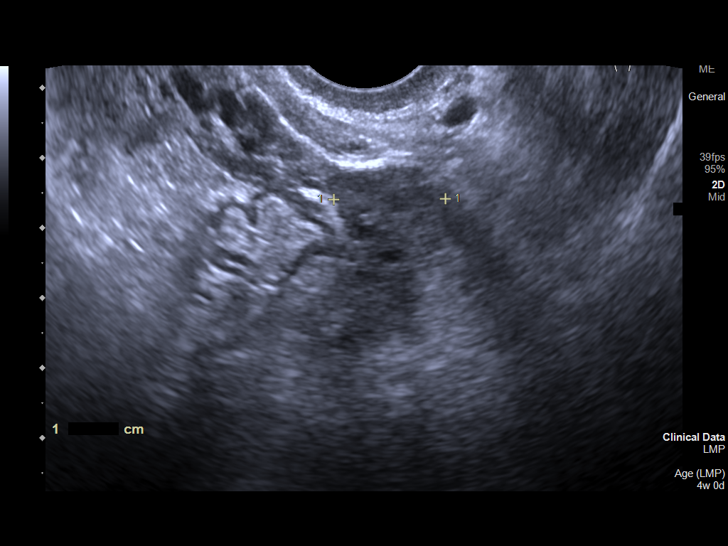

[14 of 28 positions shown; findings below may reference images not displayed]

FINDINGS: Intrauterine gestational sac: Single

Yolk sac:  Visualized

Embryo:  Not visualized

Cardiac Activity: Not visualized

Heart Rate:   bpm

MSD: 9.6 mm   5 w   5 d

CRL:    mm    w    d                  US EDC:

Subchorionic hemorrhage:  None visualized.

Maternal uterus/adnexae: No adnexal mass. Small amount of free fluid
in the pelvis.
IMPRESSION: Early intrauterine gestational sac, 5 weeks 5 days by mean sac
diameter. Yolk sac is visualized but no fetal pole currently. This
could be followed with repeat ultrasound in 14 days to ensure
expected progression.
